# Patient Record
Sex: Female | Born: 1975
Health system: Southern US, Community
[De-identification: ages and names within clinical notes are randomized; demographics above are authoritative.]

## PROBLEM LIST (undated history)

## (undated) DIAGNOSIS — D649 Anemia, unspecified: Secondary | ICD-10-CM

## (undated) DIAGNOSIS — D573 Sickle-cell trait: Secondary | ICD-10-CM

## (undated) DIAGNOSIS — D696 Thrombocytopenia, unspecified: Secondary | ICD-10-CM

## (undated) DIAGNOSIS — R896 Abnormal cytological findings in specimens from other organs, systems and tissues: Secondary | ICD-10-CM

## (undated) HISTORY — DX: Thrombocytopenia, unspecified: D69.6

## (undated) HISTORY — PX: NO PAST SURGERIES: SHX2092

## (undated) HISTORY — DX: Sickle-cell trait: D57.3

## (undated) HISTORY — DX: Anemia, unspecified: D64.9

## (undated) HISTORY — DX: Abnormal cytological findings in specimens from other organs, systems and tissues: R89.6

---

## 1997-09-09 ENCOUNTER — Inpatient Hospital Stay (HOSPITAL_COMMUNITY): Admission: AD | Admit: 1997-09-09 | Discharge: 1997-09-10 | Payer: Self-pay | Admitting: Obstetrics & Gynecology

## 1997-09-11 ENCOUNTER — Inpatient Hospital Stay (HOSPITAL_COMMUNITY): Admission: AD | Admit: 1997-09-11 | Discharge: 1997-09-11 | Payer: Self-pay | Admitting: Obstetrics

## 1998-04-17 ENCOUNTER — Inpatient Hospital Stay (HOSPITAL_COMMUNITY): Admission: AD | Admit: 1998-04-17 | Discharge: 1998-04-17 | Payer: Self-pay | Admitting: *Deleted

## 1999-02-11 DIAGNOSIS — IMO0001 Reserved for inherently not codable concepts without codable children: Secondary | ICD-10-CM

## 1999-02-11 HISTORY — DX: Reserved for inherently not codable concepts without codable children: IMO0001

## 1999-12-18 ENCOUNTER — Other Ambulatory Visit: Admission: RE | Admit: 1999-12-18 | Discharge: 1999-12-18 | Payer: Self-pay | Admitting: Internal Medicine

## 2000-03-10 ENCOUNTER — Other Ambulatory Visit: Admission: RE | Admit: 2000-03-10 | Discharge: 2000-03-10 | Payer: Self-pay | Admitting: Internal Medicine

## 2000-10-20 ENCOUNTER — Other Ambulatory Visit: Admission: RE | Admit: 2000-10-20 | Discharge: 2000-10-20 | Payer: Self-pay | Admitting: Internal Medicine

## 2002-01-04 ENCOUNTER — Other Ambulatory Visit: Admission: RE | Admit: 2002-01-04 | Discharge: 2002-01-04 | Payer: Self-pay | Admitting: Internal Medicine

## 2003-03-22 ENCOUNTER — Other Ambulatory Visit: Admission: RE | Admit: 2003-03-22 | Discharge: 2003-03-22 | Payer: Self-pay | Admitting: Internal Medicine

## 2004-04-09 ENCOUNTER — Ambulatory Visit: Payer: Self-pay | Admitting: Internal Medicine

## 2004-06-05 ENCOUNTER — Other Ambulatory Visit: Admission: RE | Admit: 2004-06-05 | Discharge: 2004-06-05 | Payer: Self-pay | Admitting: Internal Medicine

## 2004-06-05 ENCOUNTER — Ambulatory Visit: Payer: Self-pay | Admitting: Internal Medicine

## 2004-06-05 LAB — CONVERTED CEMR LAB

## 2004-12-03 ENCOUNTER — Ambulatory Visit: Payer: Self-pay | Admitting: Internal Medicine

## 2005-11-21 ENCOUNTER — Other Ambulatory Visit: Admission: RE | Admit: 2005-11-21 | Discharge: 2005-11-21 | Payer: Self-pay | Admitting: Internal Medicine

## 2005-11-21 ENCOUNTER — Encounter: Payer: Self-pay | Admitting: Internal Medicine

## 2005-11-21 ENCOUNTER — Ambulatory Visit: Payer: Self-pay | Admitting: Internal Medicine

## 2005-11-21 LAB — CONVERTED CEMR LAB
ALT: 9 units/L (ref 0–40)
Alkaline Phosphatase: 37 units/L — ABNORMAL LOW (ref 39–117)
Basophils Relative: 0.5 % (ref 0.0–1.0)
Calcium: 9.5 mg/dL (ref 8.4–10.5)
Cholesterol: 177 mg/dL (ref 0–200)
GFR calc non Af Amer: 88 mL/min
Glomerular Filtration Rate, Af Am: 107 mL/min/{1.73_m2}
Glucose, Bld: 90 mg/dL (ref 70–99)
LDL Cholesterol: 104 mg/dL — ABNORMAL HIGH (ref 0–99)
Lymphocytes Relative: 34.7 % (ref 12.0–46.0)
Platelets: 163 10*3/uL (ref 150–400)
Potassium: 4 meq/L (ref 3.5–5.1)
RBC: 4.42 M/uL (ref 3.87–5.11)
RDW: 11.7 % (ref 11.5–14.6)
TSH: 0.71 microintl units/mL (ref 0.35–5.50)
Total Bilirubin: 0.7 mg/dL (ref 0.3–1.2)
Total Protein: 7 g/dL (ref 6.0–8.3)
WBC: 5.7 10*3/uL (ref 4.5–10.5)

## 2006-10-13 ENCOUNTER — Encounter: Payer: Self-pay | Admitting: Internal Medicine

## 2006-11-23 ENCOUNTER — Other Ambulatory Visit: Admission: RE | Admit: 2006-11-23 | Discharge: 2006-11-23 | Payer: Self-pay | Admitting: Internal Medicine

## 2006-11-23 ENCOUNTER — Ambulatory Visit: Payer: Self-pay | Admitting: Internal Medicine

## 2006-11-23 ENCOUNTER — Encounter: Payer: Self-pay | Admitting: Internal Medicine

## 2006-11-23 DIAGNOSIS — F17200 Nicotine dependence, unspecified, uncomplicated: Secondary | ICD-10-CM

## 2006-11-23 LAB — CONVERTED CEMR LAB
Beta hcg, urine, semiquantitative: NEGATIVE
Bilirubin Urine: NEGATIVE
Glucose, Urine, Semiquant: NEGATIVE
Specific Gravity, Urine: 1.015
WBC Urine, dipstick: NEGATIVE
pH: 7

## 2006-11-25 LAB — CONVERTED CEMR LAB
Alkaline Phosphatase: 46 units/L (ref 39–117)
BUN: 9 mg/dL (ref 6–23)
Basophils Absolute: 0 10*3/uL (ref 0.0–0.1)
Basophils Relative: 0.9 % (ref 0.0–1.0)
CO2: 30 meq/L (ref 19–32)
Cholesterol: 161 mg/dL (ref 0–200)
Creatinine, Ser: 0.8 mg/dL (ref 0.4–1.2)
GFR calc Af Amer: 106 mL/min
HCT: 38.5 % (ref 36.0–46.0)
HDL: 64.3 mg/dL (ref >39.0)
Hemoglobin: 13.3 g/dL (ref 12.0–15.0)
Lymphocytes Relative: 44.4 % (ref 12.0–46.0)
MCHC: 34.5 g/dL (ref 30.0–36.0)
Monocytes Absolute: 0.4 10*3/uL (ref 0.2–0.7)
Monocytes Relative: 8.1 % (ref 3.0–11.0)
Neutro Abs: 2.3 10*3/uL (ref 1.4–7.7)
Neutrophils Relative %: 44.7 % (ref 43.0–77.0)
Potassium: 4.9 meq/L (ref 3.5–5.1)
Sodium: 145 meq/L (ref 135–145)
TSH: 1.75 microintl units/mL (ref 0.35–5.50)
Total Bilirubin: 0.6 mg/dL (ref 0.3–1.2)
Total Protein: 6.5 g/dL (ref 6.0–8.3)

## 2006-11-30 ENCOUNTER — Telehealth: Payer: Self-pay | Admitting: *Deleted

## 2007-04-21 ENCOUNTER — Ambulatory Visit: Payer: Self-pay | Admitting: Internal Medicine

## 2007-04-21 DIAGNOSIS — R109 Unspecified abdominal pain: Secondary | ICD-10-CM | POA: Insufficient documentation

## 2007-04-21 LAB — CONVERTED CEMR LAB
Bilirubin Urine: NEGATIVE
Nitrite: NEGATIVE
Protein, U semiquant: NEGATIVE
Urobilinogen, UA: 0.2
WBC Urine, dipstick: NEGATIVE

## 2007-04-22 ENCOUNTER — Telehealth: Payer: Self-pay | Admitting: Internal Medicine

## 2007-04-22 LAB — CONVERTED CEMR LAB
ALT: 11 units/L (ref 0–35)
Albumin: 4.1 g/dL (ref 3.5–5.2)
BUN: 6 mg/dL (ref 6–23)
Basophils Absolute: 0 10*3/uL (ref 0.0–0.1)
Bilirubin, Direct: 0.2 mg/dL (ref 0.0–0.3)
Calcium: 9.8 mg/dL (ref 8.4–10.5)
Chloride: 107 meq/L (ref 96–112)
Eosinophils Absolute: 0 10*3/uL (ref 0.0–0.6)
Eosinophils Relative: 0.8 % (ref 0.0–5.0)
GFR calc Af Amer: 106 mL/min
GFR calc non Af Amer: 88 mL/min
Glucose, Bld: 76 mg/dL (ref 70–99)
Lipase: 20 units/L (ref 11.0–59.0)
Lymphocytes Relative: 44 % (ref 12.0–46.0)
MCHC: 33.3 g/dL (ref 30.0–36.0)
MCV: 91 fL (ref 78.0–100.0)
Monocytes Relative: 7.8 % (ref 3.0–11.0)
Neutro Abs: 2.5 10*3/uL (ref 1.4–7.7)
Platelets: 134 10*3/uL — ABNORMAL LOW (ref 150–400)
RBC: 4.58 M/uL (ref 3.87–5.11)

## 2007-04-23 ENCOUNTER — Telehealth: Payer: Self-pay | Admitting: Internal Medicine

## 2007-05-07 ENCOUNTER — Ambulatory Visit: Payer: Self-pay | Admitting: Internal Medicine

## 2007-05-11 LAB — CONVERTED CEMR LAB
Basophils Absolute: 0 10*3/uL (ref 0.0–0.1)
HCT: 40.5 % (ref 36.0–46.0)
Hemoglobin: 13.4 g/dL (ref 12.0–15.0)
Lymphocytes Relative: 41.6 % (ref 12.0–46.0)
MCHC: 33 g/dL (ref 30.0–36.0)
Monocytes Absolute: 0.3 10*3/uL (ref 0.1–1.0)
Neutro Abs: 2.7 10*3/uL (ref 1.4–7.7)
RDW: 11.7 % (ref 11.5–14.6)

## 2007-06-10 ENCOUNTER — Ambulatory Visit: Payer: Self-pay | Admitting: Internal Medicine

## 2007-06-10 DIAGNOSIS — F4389 Other reactions to severe stress: Secondary | ICD-10-CM | POA: Insufficient documentation

## 2007-06-10 DIAGNOSIS — R079 Chest pain, unspecified: Secondary | ICD-10-CM | POA: Insufficient documentation

## 2007-06-10 DIAGNOSIS — F438 Other reactions to severe stress: Secondary | ICD-10-CM | POA: Insufficient documentation

## 2007-06-10 DIAGNOSIS — M546 Pain in thoracic spine: Secondary | ICD-10-CM | POA: Insufficient documentation

## 2007-11-18 ENCOUNTER — Telehealth: Payer: Self-pay | Admitting: Internal Medicine

## 2007-11-18 ENCOUNTER — Ambulatory Visit: Payer: Self-pay | Admitting: Family Medicine

## 2007-11-18 DIAGNOSIS — R071 Chest pain on breathing: Secondary | ICD-10-CM

## 2007-11-25 ENCOUNTER — Telehealth: Payer: Self-pay | Admitting: Internal Medicine

## 2008-01-14 ENCOUNTER — Encounter: Payer: Self-pay | Admitting: Internal Medicine

## 2008-01-14 ENCOUNTER — Ambulatory Visit: Payer: Self-pay | Admitting: Internal Medicine

## 2008-01-14 ENCOUNTER — Other Ambulatory Visit: Admission: RE | Admit: 2008-01-14 | Discharge: 2008-01-14 | Payer: Self-pay | Admitting: Internal Medicine

## 2008-01-14 LAB — CONVERTED CEMR LAB
Beta hcg, urine, semiquantitative: NEGATIVE
Nitrite: NEGATIVE
Specific Gravity, Urine: 1.025
WBC Urine, dipstick: NEGATIVE

## 2008-01-17 ENCOUNTER — Encounter: Payer: Self-pay | Admitting: Internal Medicine

## 2008-01-17 LAB — CONVERTED CEMR LAB
ALT: 12 units/L (ref 0–35)
Albumin: 4 g/dL (ref 3.5–5.2)
Alkaline Phosphatase: 46 units/L (ref 39–117)
BUN: 8 mg/dL (ref 6–23)
Eosinophils Relative: 1.4 % (ref 0.0–5.0)
GFR calc Af Amer: 106 mL/min
Glucose, Bld: 79 mg/dL (ref 70–99)
HCT: 39.8 % (ref 36.0–46.0)
Hemoglobin: 13.7 g/dL (ref 12.0–15.0)
Monocytes Absolute: 0.4 10*3/uL (ref 0.1–1.0)
Monocytes Relative: 7.7 % (ref 3.0–12.0)
Neutro Abs: 2.1 10*3/uL (ref 1.4–7.7)
Platelets: 145 10*3/uL — ABNORMAL LOW (ref 150–400)
Potassium: 4.2 meq/L (ref 3.5–5.1)
Total CHOL/HDL Ratio: 2.7
Total Protein: 6.8 g/dL (ref 6.0–8.3)
WBC: 4.6 10*3/uL (ref 4.5–10.5)

## 2008-01-18 ENCOUNTER — Telehealth: Payer: Self-pay | Admitting: *Deleted

## 2008-02-17 ENCOUNTER — Ambulatory Visit: Payer: Self-pay | Admitting: Internal Medicine

## 2008-02-17 DIAGNOSIS — N949 Unspecified condition associated with female genital organs and menstrual cycle: Secondary | ICD-10-CM

## 2008-02-28 ENCOUNTER — Ambulatory Visit: Payer: Self-pay | Admitting: Internal Medicine

## 2008-02-28 DIAGNOSIS — H9190 Unspecified hearing loss, unspecified ear: Secondary | ICD-10-CM | POA: Insufficient documentation

## 2008-02-28 DIAGNOSIS — H6120 Impacted cerumen, unspecified ear: Secondary | ICD-10-CM

## 2008-09-15 ENCOUNTER — Ambulatory Visit: Payer: Self-pay | Admitting: Oncology

## 2008-09-18 LAB — CBC & DIFF AND RETIC
BASO%: 0.2 % (ref 0.0–2.0)
EOS%: 0.9 % (ref 0.0–7.0)
HCT: 27.9 % — ABNORMAL LOW (ref 34.8–46.6)
HGB: 9.6 g/dL — ABNORMAL LOW (ref 11.6–15.9)
MCV: 84.8 fL (ref 79.5–101.0)
NEUT#: 2.5 10*3/uL (ref 1.5–6.5)
Platelets: 88 10*3/uL — ABNORMAL LOW (ref 145–400)
RBC: 3.29 10*6/uL — ABNORMAL LOW (ref 3.70–5.45)
Retic %: 1.91 % — ABNORMAL HIGH (ref 0.50–1.50)
Retic Ct Abs: 62.84 10*3/uL (ref 18.30–72.70)
WBC: 4.2 10*3/uL (ref 3.9–10.3)
nRBC: 0 % (ref 0–0)

## 2008-09-18 LAB — MORPHOLOGY

## 2008-09-20 LAB — IRON AND TIBC
%SAT: 39 % (ref 20–55)
Iron: 120 ug/dL (ref 42–145)
TIBC: 311 ug/dL (ref 250–470)

## 2008-09-20 LAB — HEMOGLOBINOPATHY EVALUATION
Hemoglobin Other: 0 % (ref 0.0–0.0)
Hgb A2 Quant: 2.8 % (ref 2.2–3.2)
Hgb A: 61.4 % — ABNORMAL LOW (ref 96.8–97.8)
Hgb S Quant: 35.8 % — ABNORMAL HIGH (ref 0.0–0.0)

## 2008-09-20 LAB — COMPREHENSIVE METABOLIC PANEL
ALT: 10 U/L (ref 0–35)
AST: 17 U/L (ref 0–37)
BUN: 4 mg/dL — ABNORMAL LOW (ref 6–23)
Calcium: 9 mg/dL (ref 8.4–10.5)
Chloride: 110 mEq/L (ref 96–112)
Creatinine, Ser: 0.68 mg/dL (ref 0.40–1.20)
Total Bilirubin: 0.3 mg/dL (ref 0.3–1.2)

## 2008-09-20 LAB — DIRECT ANTIGLOBULIN TEST (NOT AT ARMC)
DAT (Complement): NEGATIVE
DAT IgG: NEGATIVE

## 2008-09-20 LAB — VITAMIN B12: Vitamin B-12: 296 pg/mL (ref 211–911)

## 2008-09-20 LAB — FOLATE: Folate: >20 ng/mL

## 2008-09-20 LAB — FERRITIN: Ferritin: 37 ng/mL (ref 10–291)

## 2008-09-29 LAB — CBC WITH DIFFERENTIAL/PLATELET
BASO%: 0.6 % (ref 0.0–2.0)
EOS%: 1 % (ref 0.0–7.0)
MCH: 30.5 pg (ref 25.1–34.0)
MCHC: 34.2 g/dL (ref 31.5–36.0)
MONO#: 0.4 10*3/uL (ref 0.1–0.9)
RBC: 3.44 10*6/uL — ABNORMAL LOW (ref 3.70–5.45)
RDW: 13.2 % (ref 11.2–14.5)
WBC: 5.3 10*3/uL (ref 3.9–10.3)
lymph#: 1.9 10*3/uL (ref 0.9–3.3)

## 2008-09-29 LAB — COMPREHENSIVE METABOLIC PANEL
ALT: 10 U/L (ref 0–35)
AST: 20 U/L (ref 0–37)
Albumin: 3.2 g/dL — ABNORMAL LOW (ref 3.5–5.2)
Calcium: 8.6 mg/dL (ref 8.4–10.5)
Chloride: 109 mEq/L (ref 96–112)
Creatinine, Ser: 0.83 mg/dL (ref 0.40–1.20)
Potassium: 4.2 mEq/L (ref 3.5–5.3)
Sodium: 139 mEq/L (ref 135–145)
Total Protein: 5.6 g/dL — ABNORMAL LOW (ref 6.0–8.3)

## 2008-10-11 ENCOUNTER — Inpatient Hospital Stay (HOSPITAL_COMMUNITY): Admission: AD | Admit: 2008-10-11 | Discharge: 2008-10-15 | Payer: Self-pay | Admitting: Obstetrics and Gynecology

## 2008-10-11 ENCOUNTER — Ambulatory Visit: Payer: Self-pay | Admitting: Obstetrics & Gynecology

## 2008-10-17 ENCOUNTER — Encounter: Admission: RE | Admit: 2008-10-17 | Discharge: 2008-11-15 | Payer: Self-pay | Admitting: Obstetrics

## 2008-10-22 ENCOUNTER — Ambulatory Visit (HOSPITAL_COMMUNITY): Admission: AD | Admit: 2008-10-22 | Discharge: 2008-10-22 | Payer: Self-pay | Admitting: Obstetrics & Gynecology

## 2008-10-22 ENCOUNTER — Encounter (INDEPENDENT_AMBULATORY_CARE_PROVIDER_SITE_OTHER): Payer: Self-pay | Admitting: Obstetrics & Gynecology

## 2008-10-25 ENCOUNTER — Ambulatory Visit: Payer: Self-pay | Admitting: Oncology

## 2008-11-16 ENCOUNTER — Encounter: Admission: RE | Admit: 2008-11-16 | Discharge: 2008-12-16 | Payer: Self-pay | Admitting: Obstetrics

## 2008-12-17 ENCOUNTER — Encounter: Admission: RE | Admit: 2008-12-17 | Discharge: 2009-01-09 | Payer: Self-pay | Admitting: Obstetrics

## 2009-03-08 ENCOUNTER — Ambulatory Visit: Payer: Self-pay | Admitting: Oncology

## 2009-03-12 LAB — CBC WITH DIFFERENTIAL/PLATELET
Basophils Absolute: 0.1 10*3/uL (ref 0.0–0.1)
EOS%: 0.8 % (ref 0.0–7.0)
HCT: 36.5 % (ref 34.8–46.6)
HGB: 12.1 g/dL (ref 11.6–15.9)
LYMPH%: 33.8 % (ref 14.0–49.7)
MCH: 27.3 pg (ref 25.1–34.0)
MCV: 82.2 fL (ref 79.5–101.0)
MONO%: 5 % (ref 0.0–14.0)
NEUT%: 59.7 % (ref 38.4–76.8)
Platelets: 160 10*3/uL (ref 145–400)
RDW: 15.2 % — ABNORMAL HIGH (ref 11.2–14.5)

## 2009-03-12 LAB — IRON AND TIBC
Iron: 62 ug/dL (ref 42–145)
TIBC: 292 ug/dL (ref 250–470)
UIBC: 230 ug/dL

## 2009-03-12 LAB — COMPREHENSIVE METABOLIC PANEL
Alkaline Phosphatase: 61 U/L (ref 39–117)
BUN: 11 mg/dL (ref 6–23)
CO2: 26 mEq/L (ref 19–32)
Creatinine, Ser: 0.94 mg/dL (ref 0.40–1.20)
Glucose, Bld: 65 mg/dL — ABNORMAL LOW (ref 70–99)
Total Bilirubin: 0.3 mg/dL (ref 0.3–1.2)

## 2009-03-12 LAB — FERRITIN: Ferritin: 50 ng/mL (ref 10–291)

## 2010-05-17 LAB — COMPREHENSIVE METABOLIC PANEL
ALT: 15 U/L (ref 0–35)
ALT: 16 U/L (ref 0–35)
ALT: 16 U/L (ref 0–35)
ALT: 16 U/L (ref 0–35)
AST: 25 U/L (ref 0–37)
AST: 31 U/L (ref 0–37)
AST: 32 U/L (ref 0–37)
Albumin: 2.7 g/dL — ABNORMAL LOW (ref 3.5–5.2)
Alkaline Phosphatase: 127 U/L — ABNORMAL HIGH (ref 39–117)
CO2: 20 mEq/L (ref 19–32)
CO2: 22 mEq/L (ref 19–32)
CO2: 27 mEq/L (ref 19–32)
Calcium: 8 mg/dL — ABNORMAL LOW (ref 8.4–10.5)
Calcium: 9.1 mg/dL (ref 8.4–10.5)
Chloride: 110 mEq/L (ref 96–112)
Chloride: 111 mEq/L (ref 96–112)
Creatinine, Ser: 0.91 mg/dL (ref 0.4–1.2)
Creatinine, Ser: 0.94 mg/dL (ref 0.4–1.2)
GFR calc Af Amer: >60 mL/min (ref >60)
GFR calc Af Amer: >60 mL/min (ref >60)
GFR calc Af Amer: >60 mL/min (ref >60)
GFR calc Af Amer: >60 mL/min (ref >60)
GFR calc non Af Amer: >60 mL/min (ref >60)
GFR calc non Af Amer: >60 mL/min (ref >60)
Glucose, Bld: 66 mg/dL — ABNORMAL LOW (ref 70–99)
Potassium: 3.9 mEq/L (ref 3.5–5.1)
Potassium: 4.5 mEq/L (ref 3.5–5.1)
Sodium: 136 mEq/L (ref 135–145)
Sodium: 136 mEq/L (ref 135–145)
Sodium: 140 mEq/L (ref 135–145)
Total Bilirubin: 0.4 mg/dL (ref 0.3–1.2)
Total Bilirubin: 0.5 mg/dL (ref 0.3–1.2)
Total Protein: 4.9 g/dL — ABNORMAL LOW (ref 6.0–8.3)
Total Protein: 5.6 g/dL — ABNORMAL LOW (ref 6.0–8.3)

## 2010-05-17 LAB — CROSSMATCH

## 2010-05-17 LAB — CBC
HCT: 22.4 % — ABNORMAL LOW (ref 36.0–46.0)
HCT: 29.3 % — ABNORMAL LOW (ref 36.0–46.0)
HCT: 30.1 % — ABNORMAL LOW (ref 36.0–46.0)
HCT: 30.9 % — ABNORMAL LOW (ref 36.0–46.0)
Hemoglobin: 7.4 g/dL — CL (ref 12.0–15.0)
Hemoglobin: 9.9 g/dL — ABNORMAL LOW (ref 12.0–15.0)
MCHC: 33.4 g/dL (ref 30.0–36.0)
MCHC: 33.5 g/dL (ref 30.0–36.0)
MCHC: 33.6 g/dL (ref 30.0–36.0)
MCHC: 33.7 g/dL (ref 30.0–36.0)
MCV: 90.6 fL (ref 78.0–100.0)
MCV: 91.4 fL (ref 78.0–100.0)
MCV: 91.7 fL (ref 78.0–100.0)
MCV: 92.3 fL (ref 78.0–100.0)
Platelets: 233 10*3/uL (ref 150–400)
Platelets: 75 10*3/uL — ABNORMAL LOW (ref 150–400)
Platelets: 77 10*3/uL — ABNORMAL LOW (ref 150–400)
Platelets: 82 10*3/uL — ABNORMAL LOW (ref 150–400)
RBC: 2.76 MIL/uL — ABNORMAL LOW (ref 3.87–5.11)
RBC: 2.89 MIL/uL — ABNORMAL LOW (ref 3.87–5.11)
RBC: 2.9 MIL/uL — ABNORMAL LOW (ref 3.87–5.11)
RBC: 3.18 MIL/uL — ABNORMAL LOW (ref 3.87–5.11)
RDW: 13.1 % (ref 11.5–15.5)
RDW: 13.1 % (ref 11.5–15.5)
RDW: 13.3 % (ref 11.5–15.5)
RDW: 13.3 % (ref 11.5–15.5)
RDW: 13.5 % (ref 11.5–15.5)
RDW: 13.6 % (ref 11.5–15.5)
WBC: 12.4 10*3/uL — ABNORMAL HIGH (ref 4.0–10.5)
WBC: 13.4 10*3/uL — ABNORMAL HIGH (ref 4.0–10.5)
WBC: 16.9 10*3/uL — ABNORMAL HIGH (ref 4.0–10.5)
WBC: 8.5 10*3/uL (ref 4.0–10.5)

## 2010-05-17 LAB — APTT: aPTT: 27 seconds (ref 24–37)

## 2010-05-17 LAB — DIC (DISSEMINATED INTRAVASCULAR COAGULATION)PANEL: D-Dimer, Quant: 5.96 ug/mL-FEU — ABNORMAL HIGH (ref 0.00–0.48)

## 2010-05-17 LAB — RPR: RPR Ser Ql: NONREACTIVE

## 2010-06-25 NOTE — Consult Note (Signed)
Best, Susan                  ACCOUNT NO.:  [id number]   MEDICAL RECORD NO.:  [id number]          PATIENT TYPE:  INP   LOCATION:  9105                          FACILITY:  [facility]   PHYSICIAN:  Lendon Colonel, MD   DATE OF BIRTH:  January 01, 1976   DATE OF CONSULTATION:  10/12/2008  DATE OF DISCHARGE:                                 CONSULTATION   PREOPERATIVE DIAGNOSES:  Thrombocytopenia, arrest of descent.   POSTOPERATIVE DIAGNOSES:  Thrombocytopenia, arrest of descent.   PROCEDURE:  Primary low-transverse cesarean section.   SURGEON:  Lendon Colonel, MD   ASSISTANT:  Scheryl Darter, MD   ANESTHESIA:  Epidural.   FINDINGS:  Female infant in the LOT position, Apgars 8 and 9, 7 pounds 1  ounces.  Normal uterus, tubes, and ovaries.   SPECIMENS:  Placenta to L and D.   ANTIBIOTICS:  1 g of Ancef.   ESTIMATED BLOOD LOSS:  Unknown at time of dictation.   COMPLICATIONS:  None.   INDICATIONS:  This is a 35 year old, G1 at 40 weeks and 4 days gestation  being induced for thrombocytopenia and past EDC.  The patient had onset  of active labor after single dose of 25 mcg of Cytotec, received an  epidural with a platelet count of 82,000, and progressed rapidly to 5  cm.  At that point, she was artificially ruptured.  Fetal testing  remained reactive over the next several hours.  No change was made and  IUPC was placed and Pitocin was started over the next subsequent hours.  The patient made very slow change from 5 cm to 7 cm.  She remains at 7  cm with adequate [place] unit for 3 hours and over a total of 12  hours had only changed 2 cm.  The patient's cervix went from 5-7 cm with  no change in fetal descent.  Decision was made to proceed with the  primary low-transverse cesarean section.  Repeat CBC and coags were  obtained.  The patient was typed and crossed for 2 units of blood and  platelets were made available.   PROCEDURE:  After informed consent was obtained, risks,  benefits, and  alternatives were discussed with the patient including risk of  hemorrhage and need for blood products.  The patient was taken to the  operating room, where epidural anesthesia was found to be adequate.  She  was prepped and draped in the normal sterile fashion in the dorsal  supine position with a leftward tilt.  A Pfannenstiel skin incision made  2 cm above the pubic symphysis with a scalpel carried through to the  underlying fascia with the Bovie.  The fascia was incised in the  midline.  The incision was extended laterally with the Mayo scissors.  The superior aspect of the fascial incision was grasped with Kocher  clamps elevated up and the underlying rectus muscles dissected off the  Bovie cautery.  The inferior aspect of the fascial incision was grasped  with Kocher clamps, elevated up, and the underlying rectus muscles  dissected off sharply with the  Mayo.  No good hemostasis was noted.  The  rectus muscles were separated bluntly in the midline.  The peritoneum  was identified and entered bluntly.  Peritoneal incision was extended  superiorly and inferiorly with good visualization of the bladder.  The  bladder blade was inserted.  Vesicouterine peritoneum was identified,  grasped with pickups, and did sharply.  An incision was extended  laterally with a Metzenbaum scissors.  The bladder flap was created  digitally.  Bladder blade was reinserted.  The lower uterine segment  incised in the transverse fashion with the scalpel and extended bluntly.  Of note, a packing was placed in the patient's right side of the abdomen  due to bowel protruding.  This was done before the uterine incision.  The incision was extended.  The infants occiput was grasped, rotated  brought to the incision, and delivered.  Bulb suction was used to clear  the nose and the mouth.  Remainder of the infant was delivered without  complication.  Cord was clamped and cut.  The infant was heading to  the  awaiting pediatrician.  The placenta was expressed.  The uterus was  exteriorized, clear of all clots and debris.  The uterine incision was  repaired with 0 Vicryl in a running locked fashion.  The second layer of  the same suture was used in the imbricating fashion for good hemostasis.  An additional figure-of-eight suture was placed in the mid point of the  incision for hemostasis, 40 units of Pitocin was used for hemostasis.  The uterus was returned to the abdomen.  The gutters were cleared of all  clots and debris.  The uterine incision was reinspected.  Small amount  of bleeding was noted from the right angle of the incision.  This was  oversewn with another figure-of-eight of 0 Vicryl.  Excellent hemostasis  was done.  All tagged edges were cut.  The peritoneum was closed with 2-  0 Vicryl.  After the moist, tag was removed from the abdomen.  The  underside of the fashion, muscle edges were inspected and found to be  hemostatic.  A small defect in the fascia.  The upper edge was oversewn  with a figure-of-eight of 0 Vicryl.  The fascia was oversewn with 0  Vicryl in a running fashion in 2 halves.  A 2-0 plain was used to close  the Scarpa's later and close any dead space in the skin was closed with  staples.  The patient tolerated the procedure well.  Sponge, lap, and  needle counts were correct x3 and the patient was taken to the recovery  room in stable condition.      Lendon Colonel, MD  Electronically Signed     KAF/MEDQ  D:  10/12/2008  T:  10/13/2008  Job:  [id number]

## 2011-02-19 ENCOUNTER — Ambulatory Visit (INDEPENDENT_AMBULATORY_CARE_PROVIDER_SITE_OTHER): Payer: Self-pay | Admitting: Internal Medicine

## 2011-02-19 ENCOUNTER — Encounter: Payer: Self-pay | Admitting: Internal Medicine

## 2011-02-19 ENCOUNTER — Other Ambulatory Visit (HOSPITAL_COMMUNITY)
Admission: RE | Admit: 2011-02-19 | Discharge: 2011-02-19 | Disposition: A | Discharge status: Home / Self Care | Payer: BC Managed Care – PPO | Source: Ambulatory Visit | Attending: Internal Medicine | Admitting: Internal Medicine

## 2011-02-19 VITALS — BP 100/60 | HR 66 | Temp 98.5°F | Wt 158.0 lb

## 2011-02-19 DIAGNOSIS — L293 Anogenital pruritus, unspecified: Secondary | ICD-10-CM | POA: Insufficient documentation

## 2011-02-19 DIAGNOSIS — N760 Acute vaginitis: Secondary | ICD-10-CM | POA: Insufficient documentation

## 2011-02-19 DIAGNOSIS — L290 Pruritus ani: Secondary | ICD-10-CM

## 2011-02-19 DIAGNOSIS — Z113 Encounter for screening for infections with a predominantly sexual mode of transmission: Secondary | ICD-10-CM | POA: Insufficient documentation

## 2011-02-19 DIAGNOSIS — Z01419 Encounter for gynecological examination (general) (routine) without abnormal findings: Secondary | ICD-10-CM | POA: Insufficient documentation

## 2011-02-19 NOTE — Patient Instructions (Signed)
Will notify you  of labs when available. You exam is ok the itching may be from  Irritation in the skin creases  And can use HCS and monistat if needed. But less is best.  Call if needed in the meantime.

## 2011-02-19 NOTE — Progress Notes (Signed)
  Subjective:    Patient ID: Susan Best, female    DOB: [birth year], 36 y.o.   MRN: [id number]  HPI  Patient comes in today because of concerns about vaginal itching. She also had a new partner without using protection. She denies any specific discharge but has some itching Vaginal itching  For 1 week.  Just wants to be sure. She has not used any treatment. Last Pap and checkup with Dr. Algie Coffer was in November. She was also screened at that time  Denies any sores or ulcers is just very worried no unusual rashes. lmps pre x mas    has a iud  Mirena.  Review of Systems No fever abd pain   NVD other rash.  Past history family history social history reviewed in the electronic medical record.     Objective:   Physical Exam Well-developed well-nourished in no acute distress Abdomen:  Sof,t normal bowel sounds without hepatosplenomegaly, no guarding rebound or masses no CVA tenderness NO inguin adenopathy EXT GU nl but there is some redness in her intertriginous areas but no vesicles cracking or discharge. Cervix white creamy discharge no lesions  sample taken for GC Chlamydia herpes trach Gardnerella and yeast.     Assessment & Plan:  Vaginal itching  check for vaginitis culprits doubt STD but has GI screening is appropriate with history  Discussed this and we'll get her results as soon as they are available. She will call in couple days before the weekend to see if they are back.  Counseling for future problem prevention Disc options of blood tests and we both think not necessary at this time.  Also this may be just perineal itching related to external factors she continues hydrocortisone or Monistat as a trial.   Total visit > 50% spent counseling  About above findings  and coordinating care

## 2011-02-26 NOTE — Progress Notes (Signed)
Quick Note:  Pt aware that all results were neg. ______

## 2011-06-23 ENCOUNTER — Telehealth: Payer: Self-pay | Admitting: Internal Medicine

## 2011-06-23 NOTE — Telephone Encounter (Signed)
Pt has severe pain in head. Feels like toothache, but has been to a endodontist, but its not her teeth. Pt req work in ov with Dr Fabian Sharp tomorrow. Only sdas are avail.

## 2011-06-24 ENCOUNTER — Telehealth: Payer: Self-pay

## 2011-06-24 ENCOUNTER — Ambulatory Visit (INDEPENDENT_AMBULATORY_CARE_PROVIDER_SITE_OTHER): Payer: BC Managed Care – PPO | Admitting: Internal Medicine

## 2011-06-24 ENCOUNTER — Ambulatory Visit (INDEPENDENT_AMBULATORY_CARE_PROVIDER_SITE_OTHER)
Admission: RE | Admit: 2011-06-24 | Discharge: 2011-06-24 | Disposition: A | Discharge status: Home / Self Care | Payer: BC Managed Care – PPO | Source: Ambulatory Visit | Attending: Internal Medicine | Admitting: Internal Medicine

## 2011-06-24 ENCOUNTER — Encounter: Payer: Self-pay | Admitting: Internal Medicine

## 2011-06-24 VITALS — BP 110/74 | HR 64 | Temp 98.7°F | Wt 156.0 lb

## 2011-06-24 DIAGNOSIS — R51 Headache: Secondary | ICD-10-CM

## 2011-06-24 DIAGNOSIS — K089 Disorder of teeth and supporting structures, unspecified: Secondary | ICD-10-CM

## 2011-06-24 DIAGNOSIS — R519 Headache, unspecified: Secondary | ICD-10-CM | POA: Insufficient documentation

## 2011-06-24 DIAGNOSIS — F17200 Nicotine dependence, unspecified, uncomplicated: Secondary | ICD-10-CM

## 2011-06-24 DIAGNOSIS — K0889 Other specified disorders of teeth and supporting structures: Secondary | ICD-10-CM

## 2011-06-24 MED ORDER — PREDNISONE 20 MG PO TABS: ORAL_TABLET | ORAL | Status: AC

## 2011-06-24 NOTE — Patient Instructions (Signed)
Get the sinus ct scan  To R/O sinusitis  This could be atypical face pain and may get relief with meds used for nerve pain.  If sinus ct is normal then we may add meds for nerve pain and get neurology to see you.

## 2011-06-24 NOTE — Progress Notes (Signed)
Quick Note:  Spoke with pt and pt is aware of results. ______

## 2011-06-24 NOTE — Progress Notes (Signed)
  Subjective:    Patient ID: Susan Best, female    DOB: 1975-08-29, 36 y.o.   MRN: [id number]  HPI Patient comes in today for an acute visit work in. She's had the onset 4-5 days ago that woke her up from sleep of severe left facial pain which she calls a toothache. She had no associated fever the pain comes and goes becomes a 10 out of 10 when it is bad. It makes it hard for her to eat. She went to her dentist and then her on to don does in both of them said it was not her teeth and that she should see her primary doctor or perhaps a neurologist. She feels that it could be a sinus infection she has some mild congestion but no fever head cold. She's had no trauma to her head denies TMJ her previous symptoms such as this. She's been taking ibuprofen fairly often but this and isn't helping. Currently her pain is   down to a tolerable level but is fearful he is going to get worse again.  Review of Systems Negative for fever or syncope vision change hearing change she still smokes but no chest pain shortness of breath no history of significant migraines. She states her hearing is okay.  Past history family history social history reviewed in the electronic medical record.     Objective:   Physical Exam BP 110/74  Pulse 64  Temp(Src) 98.7 F (37.1 C) (Oral)  Wt 156 lb (70.761 kg) HEENT: Normocephalic ;atraumatic , Eyes;  PERRL, EOMs  Full, lids and conjunctiva clear,,Ears: no deformities, canals nl, TM landmarks normal, Nose: no deformity or discharge  Mouth : OP clear without lesion or edema .  Area o upper and lower tooth jaw pain without tmj tenderness  Neck: Supple without adenopathy or masses or bruits Chest:  Clear to A without wheezes rales or rhonchi CV:  S1-S2 no gallops or murmurs peripheral perfusion is normal NEURO: oriented x 3 CN 3-12 appear intact. No focal muscle weakness or atrophy.  . Gait WNL.  Grossly non focal. No tremor or abnormal movement. TMJ area is nontender she points  to the left cheek jaw area as area of pain and there is no acute rashes.     Assessment & Plan:  Left face pain tooth and jaw area  intermittent awoken from sleep persisting. This is a new onset syndrome.  Negative dental evaluation. Patient feels she could have sinusitis but is only minimally congested. We'll get sinus CT to rule out sinus causes of her pain consider treatment for atypical facial pain versus migraine and referral as appropriate.  Tobacco advise DC  Addendum CT scan was negative for sinus disease or abnormality. Encouraged to take prednisone 5 days for inflammatory response. She asks about pain pill we may add medicine after that consider gabapentin or Tegretol or other.refer to neurology.

## 2011-06-24 NOTE — Telephone Encounter (Signed)
Pt called this morning to check on status of getting work in ov for today. Pt has been sch for ov today at 2:15pm.

## 2011-06-24 NOTE — Telephone Encounter (Signed)
Pt is aware of ct sinus results.  Pt states she has some reservations about prednsione.  Pt states when she was pregnant pt had to go on prednisone and her lab was induced.  Pt states she will try prednisone if Dr. Fabian Sharp recommends this.  Rx sent to pharmacy.  Advised pt to call back on Friday to report how pt is feeling.

## 2011-12-13 ENCOUNTER — Encounter (HOSPITAL_COMMUNITY): Payer: Self-pay

## 2011-12-13 ENCOUNTER — Emergency Department (HOSPITAL_COMMUNITY)
Admission: EM | Admit: 2011-12-13 | Discharge: 2011-12-14 | Disposition: A | Discharge status: Home / Self Care | Payer: BC Managed Care – PPO | Attending: Emergency Medicine | Admitting: Emergency Medicine

## 2011-12-13 DIAGNOSIS — Y9301 Activity, walking, marching and hiking: Secondary | ICD-10-CM | POA: Insufficient documentation

## 2011-12-13 DIAGNOSIS — Y9289 Other specified places as the place of occurrence of the external cause: Secondary | ICD-10-CM | POA: Insufficient documentation

## 2011-12-13 DIAGNOSIS — K0889 Other specified disorders of teeth and supporting structures: Secondary | ICD-10-CM

## 2011-12-13 DIAGNOSIS — F411 Generalized anxiety disorder: Secondary | ICD-10-CM | POA: Insufficient documentation

## 2011-12-13 DIAGNOSIS — W010XXA Fall on same level from slipping, tripping and stumbling without subsequent striking against object, initial encounter: Secondary | ICD-10-CM | POA: Insufficient documentation

## 2011-12-13 DIAGNOSIS — F17200 Nicotine dependence, unspecified, uncomplicated: Secondary | ICD-10-CM | POA: Insufficient documentation

## 2011-12-13 NOTE — ED Notes (Signed)
Pt sts fell today tripped in walkway and hit mouth on cement, pt chipped front tooth and complains and pain an loose feeling to teeth, minimal swelling noted to upper lip.

## 2011-12-14 NOTE — ED Notes (Signed)
See paper chart for discharge

## 2011-12-15 NOTE — ED Provider Notes (Signed)
Medical screening examination/treatment/procedure(s) were performed by non-physician practitioner and as supervising physician I was immediately available for consultation/collaboration.    Vida Roller, MD 12/15/11 [phone]

## 2011-12-15 NOTE — ED Provider Notes (Signed)
History     CSN: [id number]  Arrival date & time 12/13/11  2339   First MD Initiated Contact with Patient 12/14/11 0016      Chief Complaint  Patient presents with  . Fall    (Consider location/radiation/quality/duration/timing/severity/associated sxs/prior treatment) HPI Comments: 36 y/o female presents to the ED with a chipped and loose front tooth after tripping in her walkway hitting her mouth on cement prior to arrival to ED. Denies hitting her head or LOC. She was wearing really high heels and one heel got stuck causing her to trip. She chipped the upper left front tooth and her upper right front tooth feels loose. She tried call her dentist's emergency line and has not yet had a call back. States her teeth hurt and feel sensitive. She began bleeding right away. Denies any other injuries.  Patient is a 36 y.o. female presenting with fall. The history is provided by the patient.  Fall Pertinent negatives include no headaches.    Past Medical History  Diagnosis Date  . Abnormal finding on Pap smear, ASCUS 2001    neg hpv    Past Surgical History  Procedure Date  . No past surgeries     Family History  Problem Relation Age of Onset  . Hyperlipidemia    . Hypertension    . GER disease Father     History  Substance Use Topics  . Smoking status: Current Every Day Smoker -- 1.0 packs/day    Types: Cigarettes  . Smokeless tobacco: Not on file  . Alcohol Use: Yes     Comment: 1-2 per day    OB History    Grav Para Term Preterm Abortions TAB SAB Ect Mult Living                  Review of Systems  HENT: Positive for dental problem. Negative for neck pain.   Respiratory: Negative for choking.   Skin: Negative for color change and wound.  Neurological: Negative for headaches.  Psychiatric/Behavioral: Negative for confusion. The patient is nervous/anxious.     Allergies  Review of patient's allergies indicates no known allergies.  Home Medications   Current  Outpatient Rx  Name  Route  Sig  Dispense  Refill  . RANITIDINE HCL 150 MG PO TABS   Oral   Take 150 mg by mouth 2 (two) times daily as needed. For acid reflux           BP 110/85  Pulse 62  Temp 98.1 F (36.7 C) (Oral)  Resp 18  SpO2 98%  Physical Exam  Constitutional: She is oriented to person, place, and time. She appears well-developed and well-nourished. No distress.       Crying   HENT:  Head: Normocephalic and atraumatic. Head is without laceration.  Mouth/Throat: Oropharynx is clear and moist. No lacerations.         Mild edema in middle of lower lip. No laceration or bleeding.  Eyes: Conjunctivae normal and EOM are normal. Pupils are equal, round, and reactive to light.  Neck: Normal range of motion. Neck supple.  Cardiovascular: Normal rate, regular rhythm and normal heart sounds.   Musculoskeletal: Normal range of motion.  Neurological: She is alert and oriented to person, place, and time.  Skin: Skin is warm and dry.  Psychiatric: Her speech is normal and behavior is normal. Her mood appears anxious.    ED Course  Procedures (including critical care time) Dental paste used to bond right  front tooth to both surrounding teeth. Labs Reviewed - No data to display No results found.   1. Loose tooth due to trauma       MDM  Loose tooth bonded with dental paste. No subluxation or dislocation. Bleeding controlled. Patient has dentist to f/u with.        Trevor Mace, PA-C 12/15/11 [phone]

## 2012-01-23 ENCOUNTER — Telehealth: Payer: Self-pay | Admitting: Internal Medicine

## 2012-01-23 NOTE — Telephone Encounter (Signed)
Called and spoke to the pt.  She was notified that [facility] is out of the office for the remainder of this week and next week.  She will need to come in and be seen by another physician.  Sent to scheduling to make the appt.

## 2012-01-23 NOTE — Telephone Encounter (Signed)
Patient called stating that she fell in November and hurt her right knee and now she is having pain upon trying to bend it and would like to have an xray. Please advise/assist.

## 2012-01-27 ENCOUNTER — Encounter: Payer: Self-pay | Admitting: Family Medicine

## 2012-01-27 ENCOUNTER — Ambulatory Visit: Payer: BC Managed Care – PPO | Admitting: Family Medicine

## 2012-01-27 ENCOUNTER — Ambulatory Visit (INDEPENDENT_AMBULATORY_CARE_PROVIDER_SITE_OTHER): Payer: BC Managed Care – PPO | Admitting: Family Medicine

## 2012-01-27 VITALS — BP 106/76 | HR 70 | Temp 98.0°F | Wt 158.0 lb

## 2012-01-27 DIAGNOSIS — M25569 Pain in unspecified knee: Secondary | ICD-10-CM

## 2012-01-27 MED ORDER — DICLOFENAC SODIUM 75 MG PO TBEC: 75.0000 mg | DELAYED_RELEASE_TABLET | Freq: Two times a day (BID) | ORAL | Status: DC

## 2012-01-27 NOTE — Progress Notes (Signed)
  Subjective:    Patient ID: Susan Best, female    DOB: [birth year], 36 y.o.   MRN: [id number]  HPI Here for 2 weeks of pain in the right knee. She fell in her driveway on 12-13-11 and struck her face, and she went to the ER for this. At that time her knee did not bother her. Then 2 weeks ago she developed some pain in the front of the knee under the kneecap, especially when walking up steps or squatting down. No swelling. No locking or giving way. She has done nothing for it.    Review of Systems  Constitutional: Negative.   Musculoskeletal: Positive for arthralgias. Negative for joint swelling.       Objective:   Physical Exam  Constitutional: She appears well-developed and well-nourished.       She walks normally   Musculoskeletal:       The right knee has no swelling, no tenderness at all. Full ROM, negative anterior drawer and McMurrays           Assessment & Plan:  Possible tendonitis S/P trauma. Rest, wear an elastic support brace. Use Diclofenac bid for several weeks. Recheck prn

## 2012-07-13 ENCOUNTER — Encounter: Payer: Self-pay | Admitting: Family

## 2012-07-13 ENCOUNTER — Ambulatory Visit (INDEPENDENT_AMBULATORY_CARE_PROVIDER_SITE_OTHER): Payer: BC Managed Care – PPO | Admitting: Family

## 2012-07-13 VITALS — BP 96/62 | HR 76 | Wt 162.0 lb

## 2012-07-13 DIAGNOSIS — T148XXA Other injury of unspecified body region, initial encounter: Secondary | ICD-10-CM

## 2012-07-13 DIAGNOSIS — M545 Low back pain, unspecified: Secondary | ICD-10-CM
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
## 2012-12-16 ENCOUNTER — Other Ambulatory Visit: Payer: Self-pay

## 2013-02-09 ENCOUNTER — Ambulatory Visit (INDEPENDENT_AMBULATORY_CARE_PROVIDER_SITE_OTHER): Payer: BC Managed Care – PPO | Admitting: Family Medicine

## 2013-02-09 DIAGNOSIS — Z23 Encounter for immunization: Secondary | ICD-10-CM

## 2013-07-12 ENCOUNTER — Other Ambulatory Visit: Payer: Self-pay

## 2013-07-12 DIAGNOSIS — Z1231 Encounter for screening mammogram for malignant neoplasm of breast: Secondary | ICD-10-CM

## 2013-07-28 ENCOUNTER — Ambulatory Visit
Admission: RE | Admit: 2013-07-28 | Discharge: 2013-07-28 | Disposition: A | Discharge status: Home / Self Care | Payer: BC Managed Care – PPO | Source: Ambulatory Visit

## 2013-07-28 DIAGNOSIS — Z1231 Encounter for screening mammogram for malignant neoplasm of breast: Secondary | ICD-10-CM

## 2013-12-12 ENCOUNTER — Ambulatory Visit (INDEPENDENT_AMBULATORY_CARE_PROVIDER_SITE_OTHER): Payer: Self-pay | Admitting: Family Medicine

## 2013-12-12 VITALS — BP 102/60 | HR 66 | Temp 97.8°F | Resp 16

## 2013-12-12 DIAGNOSIS — R6883 Chills (without fever): Secondary | ICD-10-CM

## 2013-12-12 DIAGNOSIS — R42 Dizziness and giddiness: Secondary | ICD-10-CM

## 2013-12-12 LAB — POCT CBC
Granulocyte percent: 63 % (ref 37–80)
HCT, POC: 42.8 % (ref 37.7–47.9)
Hemoglobin: 14 g/dL (ref 12.2–16.2)
Lymph, poc: 1.4 (ref 0.6–3.4)
MCH, POC: 29.2 pg (ref 27–31.2)
MCHC: 32.6 g/dL (ref 31.8–35.4)
MCV: 89.5 fL (ref 80–97)
MID (cbc): 0.2 (ref 0–0.9)
MPV: 7.9 fL (ref 0–99.8)
POC Granulocyte: 2.8 (ref 2–6.9)
POC LYMPH PERCENT: 32.2 %L (ref 10–50)
POC MID %: 4.8 % (ref 0–12)
Platelet Count, POC: 139 10*3/uL — AB (ref 142–424)
RBC: 4.78 M/uL (ref 4.04–5.48)
RDW, POC: 12.2 %
WBC: 4.4 10*3/uL — AB (ref 4.6–10.2)

## 2013-12-12 LAB — POCT UA - MICROSCOPIC ONLY
Bacteria, U Microscopic: NEGATIVE
Casts, Ur, LPF, POC: NEGATIVE
Crystals, Ur, HPF, POC: NEGATIVE
Mucus, UA: NEGATIVE
Yeast, UA: NEGATIVE

## 2013-12-12 LAB — POCT URINALYSIS DIPSTICK
Bilirubin, UA: NEGATIVE
Blood, UA: NEGATIVE
Glucose, UA: NEGATIVE
Ketones, UA: NEGATIVE
Leukocytes, UA: NEGATIVE
Nitrite, UA: NEGATIVE
Protein, UA: NEGATIVE
Spec Grav, UA: <=1.005
Urobilinogen, UA: 0.2
pH, UA: 6.5

## 2013-12-12 NOTE — Progress Notes (Signed)
Chief Complaint:  Chief Complaint  Patient presents with  . Dizziness    Started this morning. Pt. felt like she was about to fall out.   . Body Shakes    Started this morning. Comes and goes.     HPI: Susan Best is a 38 y.o. female who is here for  Intermittent light headedness when she was smoking, she was going form sitting to standing position. She denies any HA, vision changes, n/w/tingling, slurred speech, CP, palpitations. She then got the chills. She had intermittent dizziness for about 1 hours and she if feeling a bit better. The room is not spinning, she is just light headed. Has been eating and drinking normal. She is a smoker 1/2 ppd for many years. No other sxs.  She has not had any urinary sxs, no prior hx of vertgo, this has never happened before, no ear sxs. No new meds.   Past Medical History  Diagnosis Date  . Abnormal finding on Pap smear, ASCUS 2001    neg hpv  . Thrombocytopenia    Past Surgical History  Procedure Laterality Date  . No past surgeries     History   Social History  . Marital Status: Single    Spouse Name: N/A    Number of Children: N/A  . Years of Education: N/A   Social History Main Topics  . Smoking status: Current Every Day Smoker -- 1.00 packs/day    Types: Cigarettes  . Smokeless tobacco: Never Used  . Alcohol Use: 1.5 oz/week    3 drink(s) per week  . Drug Use: No  . Sexual Activity: None   Other Topics Concern  . None   Social History Narrative   Occupation: 40 per week   7 hours of sleep   HH of 1   No pets   Exercise occasion         Family History  Problem Relation Age of Onset  . Hyperlipidemia    . Hypertension    . GER disease Father    No Known Allergies Prior to Admission medications   Medication Sig Start Date End Date Taking? Authorizing Provider  cyclobenzaprine (FLEXERIL) 10 MG tablet Take 10 mg by mouth 3 (three) times daily as needed for muscle spasms.    Historical Provider, MD    diclofenac (VOLTAREN) 75 MG EC tablet Take 1 tablet (75 mg total) by mouth 2 (two) times daily. 01/27/12   Nelwyn SalisburyStephen A Fry, MD  ketorolac (TORADOL) 10 MG tablet Take 10 mg by mouth every 6 (six) hours as needed for pain.    Historical Provider, MD  ranitidine (ZANTAC) 150 MG tablet Take 150 mg by mouth 2 (two) times daily as needed. For acid reflux    Historical Provider, MD     ROS: The patient denies fevers,  night sweats, unintentional weight loss, chest pain, palpitations, wheezing, dyspnea on exertion, nausea, vomiting, abdominal pain, dysuria, hematuria, melena, numbness, weakness, or tingling.   All other systems have been reviewed and were otherwise negative with the exception of those mentioned in the HPI and as above.    PHYSICAL EXAM: Filed Vitals:   12/12/13 1213  BP: 102/60  Pulse: 66  Temp: 97.8 F (36.6 C)  Resp: 16   There were no vitals filed for this visit. There is no weight on file to calculate BMI.  General: Alert, no acute distress HEENT:  Normocephalic, atraumatic, oropharynx patent. EOMI, PERRLA. Fundo exam normal. TM  nl.  Cardiovascular:  Regular rate and rhythm, no rubs murmurs or gallops.  No Carotid bruits, radial pulse intact. No pedal edema.  Respiratory: Clear to auscultation bilaterally.  No wheezes, rales, or rhonchi.  No cyanosis, no use of accessory musculature GI: No organomegaly, abdomen is soft and non-tender, positive bowel sounds.  No masses. Skin: No rashes. Neurologic: Facial musculature symmetric. CN 2 -1 2 grossly nl. ROmberg neg, heel to toe neg, finger to nose neg. NEg dix hallpike, neg nystagmus Psychiatric: Patient is appropriate throughout our interaction. No slurred sppech Lymphatic: No cervical lymphadenopathy Musculoskeletal: Gait intact. 5/5 strength, 2/2 DTRs, sensation UE and [place] intact   LABS: Results for orders placed or performed in visit on 12/12/13  POCT UA - Microscopic Only  Result Value Ref Range   WBC, Ur, HPF, POC  0-1    RBC, urine, microscopic 0-2    Bacteria, U Microscopic neg    Mucus, UA neg    Epithelial cells, urine per micros 0-1    Crystals, Ur, HPF, POC neg    Casts, Ur, LPF, POC neg    Yeast, UA neg   POCT urinalysis dipstick  Result Value Ref Range   Color, UA light yellow    Clarity, UA clear    Glucose, UA neg    Bilirubin, UA neg    Ketones, UA neg    Spec Grav, UA <=1.005    Blood, UA neg    pH, UA 6.5    Protein, UA neg    Urobilinogen, UA 0.2    Nitrite, UA neg    Leukocytes, UA Negative   POCT CBC  Result Value Ref Range   WBC 4.4 (A) 4.6 - 10.2 K/uL   Lymph, poc 1.4 0.6 - 3.4   POC LYMPH PERCENT 32.2 10 - 50 %L   MID (cbc) 0.2 0 - 0.9   POC MID % 4.8 0 - 12 %M   POC Granulocyte 2.8 2 - 6.9   Granulocyte percent 63.0 37 - 80 %G   RBC 4.78 4.04 - 5.48 M/uL   Hemoglobin 14.0 12.2 - 16.2 g/dL   HCT, POC 16.142.8 09.637.7 - 47.9 %   MCV 89.5 80 - 97 fL   MCH, POC 29.2 27 - 31.2 pg   MCHC 32.6 31.8 - 35.4 g/dL   RDW, POC 04.512.2 %   Platelet Count, POC 139 (A) 142 - 424 K/uL   MPV 7.9 0 - 99.8 fL     EKG/XRAY:   [facility]   ASSESSMENT/PLAN: Encounter Diagnoses  Name Primary?  . Dizziness and giddiness Yes  . Chills     [age] y.o female with PMH of tobacco use/dependence who is here for intermittent dizziness for 1 hour She was borderline orthostatic in BP and definitive in HR, Based on history it sound very positional.  BP laying 110/74, 54; Sitting 102/60, 66; Standing 116/79, 79 EKG was Sinus Brady at 58 bpm, anterior T waves nonspecific, and she had slightly y multiform P waves in lead II only.  She is a smoker.  CBC and urine was normal CMP pending I went ahead and gave her precautions, push fluids, no caffeine. She will go to ER prn F/u prn   Gross sideeffects, risk and benefits, and alternatives of medications d/w patient. Patient is aware that all medications have potential sideeffects and we are unable to predict every  sideeffect or drug-drug interaction that may occur.  Susan Werntz PHUONG,  DO 12/12/2013 1:46 PM

## 2013-12-12 NOTE — Patient Instructions (Signed)
Orthostatic Hypotension Orthostatic hypotension is a sudden drop in blood pressure. It happens when you quickly stand up from a seated or lying position. You may feel dizzy or light-headed. This can last for just a few seconds or for up to a few minutes. It is usually not a serious problem. However, if this happens frequently or gets worse, it can be a sign of something more serious. CAUSES  Different things can cause orthostatic hypotension, including:   Loss of body fluids (dehydration).  Medicines that lower blood pressure.  Sudden changes in posture, such as standing up quickly after you have been sitting or lying down.  Taking too much of your medicine. SIGNS AND SYMPTOMS   Light-headedness or dizziness.   Fainting or near-fainting.   A fast heart rate.   Weakness.   Feeling tired (fatigue).  DIAGNOSIS  Your health care provider may do several things to help diagnose your condition and identify the cause. These may include:   Taking a medical history and doing a physical exam.  Checking your blood pressure. Your health care provider will check your blood pressure when you are:  Lying down.  Sitting.  Standing.  Using tilt table testing. In this test, you lie down on a table that moves from a lying position to a standing position. You will be strapped onto the table. This test monitors your blood pressure and heart rate when you are in different positions. TREATMENT  Treatment will vary depending on the cause. Possible treatments include:   Changing the dosage of your medicines.  Wearing compression stockings on your lower legs.  Standing up slowly after sitting or lying down.  Eating more salt.  Eating frequent, small meals.  In some cases, getting IV fluids.  Taking medicine to enhance fluid retention. HOME CARE INSTRUCTIONS  Only take over-the-counter or prescription medicines as directed by your health care provider.  Follow your health care  provider's instructions for changing the dosage of your current medicines.  Do not stop or adjust your medicine on your own.  Stand up slowly after sitting or lying down. This allows your body to adjust to the different position.  Wear compression stockings as directed.  Eat extra salt as directed.  Do not add extra salt to your diet unless directed to by your health care provider.  Eat frequent, small meals.  Avoid standing suddenly after eating.  Avoid hot showers or excessive heat as directed by your health care provider.  Keep all follow-up appointments. SEEK MEDICAL CARE IF:  You continue to feel dizzy or light-headed after standing.  You feel groggy or confused.  You feel cold, clammy, or sick to your stomach (nauseous).  You have blurred vision.  You feel short of breath. SEEK IMMEDIATE MEDICAL CARE IF:   You faint after standing.  You have chest pain.  You have difficulty breathing.   You lose feeling or movement in your arms or legs.   You have slurred speech or difficulty talking, or you are unable to talk.  MAKE SURE YOU:   Understand these instructions.  Will watch your condition.  Will get help right away if you are not doing well or get worse. Document Released: 01/17/2002 Document Revised: 02/01/2013 Document Reviewed: 11/19/2012 [facility] Patient Information 2015 [place], [place]. This information is not intended to replace advice given to you by your health care provider. Make sure you discuss any questions you have with your health care provider. Hypotension As your heart beats, it forces blood  through your arteries. This force is your blood pressure. If your blood pressure is too low for you to go about your normal activities or to support the organs of your body, you have hypotension. Hypotension is also referred to as low blood pressure. When your blood pressure becomes too low, you may not get enough blood to your brain. As a result, you may  feel weak, feel lightheaded, or develop a rapid heart rate. In a more severe case, you may faint. CAUSES Various conditions can cause hypotension. These include:  Blood loss.  Dehydration.  Heart or endocrine problems.  Pregnancy.  Severe infection.  Not having a well-balanced diet filled with needed nutrients.  Severe allergic reactions (anaphylaxis). Some medicines, such as blood pressure medicine or water pills (diuretics), may lower your blood pressure below normal. Sometimes taking too much medicine or taking medicine not as directed can cause hypotension. TREATMENT  Hospitalization is sometimes required for hypotension if fluid or blood replacement is needed, if time is needed for medicines to wear off, or if further monitoring is needed. Treatment might include changing your diet, changing your medicines (including medicines aimed at raising your blood pressure), and use of support stockings. HOME CARE INSTRUCTIONS   Drink enough fluids to keep your urine clear or pale yellow.  Take your medicines as directed by your health care provider.  Get up slowly from reclining or sitting positions. This gives your blood pressure a chance to adjust.  Wear support stockings as directed by your health care provider.  Maintain a healthy diet by including nutritious food, such as fruits, vegetables, nuts, whole grains, and lean meats. SEEK MEDICAL CARE IF:  You have vomiting or diarrhea.  You have a fever for more than 2-3 days.  You feel more thirsty than usual.  You feel weak and tired. SEEK IMMEDIATE MEDICAL CARE IF:   You have chest pain or a fast or irregular heartbeat.  You have a loss of feeling in some part of your body, or you lose movement in your arms or legs.  You have trouble speaking.  You become sweaty or feel lightheaded.  You faint. MAKE SURE YOU:   Understand these instructions.  Will watch your condition.  Will get help right away if you are not  doing well or get worse. Document Released: 01/27/2005 Document Revised: 11/17/2012 Document Reviewed: 07/30/2012 [facility] Patient Information 2015 [place], [place]. This information is not intended to replace advice given to you by your health care provider. Make sure you discuss any questions you have with your health care provider.

## 2013-12-13 ENCOUNTER — Telehealth: Payer: Self-pay

## 2013-12-13 LAB — COMPREHENSIVE METABOLIC PANEL WITH GFR
AST: 15 U/L (ref 0–37)
Alkaline Phosphatase: 45 U/L (ref 39–117)
Creat: 0.72 mg/dL (ref 0.50–1.10)
Potassium: 4.5 meq/L (ref 3.5–5.3)

## 2013-12-13 LAB — COMPREHENSIVE METABOLIC PANEL
ALT: 9 U/L (ref 0–35)
Albumin: 4.5 g/dL (ref 3.5–5.2)
BUN: 9 mg/dL (ref 6–23)
CO2: 25 mEq/L (ref 19–32)
Calcium: 9.7 mg/dL (ref 8.4–10.5)
Chloride: 105 mEq/L (ref 96–112)
Glucose, Bld: 83 mg/dL (ref 70–99)
Sodium: 139 mEq/L (ref 135–145)
Total Bilirubin: 0.4 mg/dL (ref 0.2–1.2)
Total Protein: 6.7 g/dL (ref 6.0–8.3)

## 2013-12-13 NOTE — Telephone Encounter (Signed)
Dr. Conley RollsLe, pt returning a call in regards to your MyChart message. Was unable to see a way to reply. Pt feels somewhat better today. Still dizzy when moves suddenly. Drinking lots of fluids. Pt concerned about her abnl EKG. Wants to know if you can comment on that. Thanks  Best #: [phone]

## 2013-12-13 NOTE — Telephone Encounter (Signed)
Spoke with patient , she is getting better gradually. She is asymptomatic for sudden movements. Will cont to monitor, will call me next week to let me know. Declined referral to cardiology since has no insurance, she is in between jobs. We may just get a repeat EKG.

## 2013-12-30 ENCOUNTER — Encounter: Payer: Self-pay | Admitting: Family Medicine

## 2013-12-30 ENCOUNTER — Ambulatory Visit (INDEPENDENT_AMBULATORY_CARE_PROVIDER_SITE_OTHER): Payer: Self-pay | Admitting: Family Medicine

## 2013-12-30 VITALS — BP 109/75 | HR 80 | Temp 98.4°F | Resp 16 | Ht 62.0 in | Wt 162.6 lb

## 2013-12-30 DIAGNOSIS — R9431 Abnormal electrocardiogram [ECG] [EKG]: Secondary | ICD-10-CM

## 2013-12-30 DIAGNOSIS — R42 Dizziness and giddiness: Secondary | ICD-10-CM

## 2013-12-30 NOTE — Progress Notes (Signed)
Chief Complaint:  Chief Complaint  Patient presents with  . Follow-up  . Dizziness    repeat ekg    HPI: Susan Best is a 38 y.o. female who is here for  Follow-up, wants a repeat EKG She saw me about 2 weeks ago and complained of dizziness, she was slightly orthostatic. An Ekg was done  and showed some right atrial changes, possible minimal enlargement She has risk factors for heart disease such as smoking. But deneis hyperlipidemia, DM. She was self pay and basic labs were done which were all normal.  She was a 20 year 1 pppd  But after that she is now down to 1/2 ppd  12/12/13 OV Susan Best is a 38 y.o. female who is here for Intermittent light headedness when she was smoking, she was going form sitting to standing position. She denies any HA, vision changes, n/w/tingling, slurred speech, CP, palpitations. She then got the chills. She had intermittent dizziness for about 1 hours and she if feeling a bit better. The room is not spinning, she is just light headed. Has been eating and drinking normal. She is a smoker 1/2 ppd for many years. No other sxs.  She has not had any urinary sxs, no prior hx of vertgo, this has never happened before, no ear sxs. No new meds.   Past Medical History  Diagnosis Date  . Abnormal finding on Pap smear, ASCUS 2001    neg hpv  . Thrombocytopenia   . Sickle cell trait   . Anemia     with pregnancy only , dropped down to hgb 7.4   Past Surgical History  Procedure Laterality Date  . No past surgeries     History   Social History  . Marital Status: Single    Spouse Name: N/A    Number of Children: N/A  . Years of Education: N/A   Social History Main Topics  . Smoking status: Current Every Day Smoker -- 1.00 packs/day    Types: Cigarettes  . Smokeless tobacco: Never Used  . Alcohol Use: 1.5 oz/week    3 drink(s) per week  . Drug Use: No  . Sexual Activity: None   Other Topics Concern  . None   Social History Narrative   Occupation: 40 per week   7 hours of sleep   HH of 1   No pets   Exercise occasion         Family History  Problem Relation Age of Onset  . Hyperlipidemia    . Hypertension    . GER disease Father    No Known Allergies Prior to Admission medications   Medication Sig Start Date End Date Taking? Authorizing Provider  cyclobenzaprine (FLEXERIL) 10 MG tablet Take 10 mg by mouth 3 (three) times daily as needed for muscle spasms.   Yes Historical Provider, MD  diclofenac (VOLTAREN) 75 MG EC tablet Take 1 tablet (75 mg total) by mouth 2 (two) times daily. 01/27/12  Yes Nelwyn SalisburyStephen A Fry, MD  ketorolac (TORADOL) 10 MG tablet Take 10 mg by mouth every 6 (six) hours as needed for pain.   Yes Historical Provider, MD  ranitidine (ZANTAC) 150 MG tablet Take 150 mg by mouth 2 (two) times daily as needed. For acid reflux   Yes Historical Provider, MD     ROS: The patient denies fevers, chills, night sweats, unintentional weight loss, chest pain, palpitations, wheezing, dyspnea on exertion, nausea, vomiting, abdominal pain, dysuria,  hematuria, melena, numbness, weakness, or tingling.   All other systems have been reviewed and were otherwise negative with the exception of those mentioned in the HPI and as above.    PHYSICAL EXAM: Filed Vitals:   12/30/13 1257  BP: 109/75  Pulse: 80  Temp: 98.4 F (36.9 C)  Resp: 16   Filed Vitals:   12/30/13 1257  Height: 5\' 2"  (1.575 m)  Weight: 162 lb 9.6 oz (73.755 kg)   Body mass index is 29.73 kg/(m^2).  General: Alert, no acute distress HEENT:  Normocephalic, atraumatic, oropharynx patent. EOMI, PERRLA Cardiovascular:  Regular rate and rhythm, no rubs murmurs or gallops.  No Carotid bruits, radial pulse intact. No pedal edema.  Respiratory: Clear to auscultation bilaterally.  No wheezes, rales, or rhonchi.  No cyanosis, no use of accessory musculature GI: No organomegaly, abdomen is soft and non-tender, positive bowel sounds.  No masses. Skin: No  rashes. Neurologic: Facial musculature symmetric. Psychiatric: Patient is appropriate throughout our interaction. Lymphatic: No cervical lymphadenopathy Musculoskeletal: Gait intact.   LABS: Results for orders placed or performed in visit on 12/12/13  Comprehensive metabolic panel  Result Value Ref Range   Sodium 139 135 - 145 mEq/L   Potassium 4.5 3.5 - 5.3 mEq/L   Chloride 105 96 - 112 mEq/L   CO2 25 19 - 32 mEq/L   Glucose, Bld 83 70 - 99 mg/dL   BUN 9 6 - 23 mg/dL   Creat 1.61 0.96 - 0.45 mg/dL   Total Bilirubin 0.4 0.2 - 1.2 mg/dL   Alkaline Phosphatase 45 39 - 117 U/L   AST 15 0 - 37 U/L   ALT 9 0 - 35 U/L   Total Protein 6.7 6.0 - 8.3 g/dL   Albumin 4.5 3.5 - 5.2 g/dL   Calcium 9.7 8.4 - 40.9 mg/dL  POCT UA - Microscopic Only  Result Value Ref Range   WBC, Ur, HPF, POC 0-1    RBC, urine, microscopic 0-2    Bacteria, U Microscopic neg    Mucus, UA neg    Epithelial cells, urine per micros 0-1    Crystals, Ur, HPF, POC neg    Casts, Ur, LPF, POC neg    Yeast, UA neg   POCT urinalysis dipstick  Result Value Ref Range   Color, UA light yellow    Clarity, UA clear    Glucose, UA neg    Bilirubin, UA neg    Ketones, UA neg    Spec Grav, UA <=1.005    Blood, UA neg    pH, UA 6.5    Protein, UA neg    Urobilinogen, UA 0.2    Nitrite, UA neg    Leukocytes, UA Negative   POCT CBC  Result Value Ref Range   WBC 4.4 (A) 4.6 - 10.2 K/uL   Lymph, poc 1.4 0.6 - 3.4   POC LYMPH PERCENT 32.2 10 - 50 %L   MID (cbc) 0.2 0 - 0.9   POC MID % 4.8 0 - 12 %M   POC Granulocyte 2.8 2 - 6.9   Granulocyte percent 63.0 37 - 80 %G   RBC 4.78 4.04 - 5.48 M/uL   Hemoglobin 14.0 12.2 - 16.2 g/dL   HCT, POC 81.1 91.4 - 47.9 %   MCV 89.5 80 - 97 fL   MCH, POC 29.2 27 - 31.2 pg   MCHC 32.6 31.8 - 35.4 g/dL   RDW, POC 78.2 %   Platelet Count, POC 139 (  A) 142 - 424 K/uL   MPV 7.9 0 - 99.8 fL     EKG/XRAY:   [facility] EKG unchanged from  12/11/13 No ST elevation depression, there is some slight atrial changes/enlargment/multiform p waves No overt bundle branch block   ASSESSMENT/PLAN: Encounter Diagnoses  Name Primary?  . Dizziness and giddiness Yes  . Nonspecific abnormal electrocardiogram (ECG) (EKG)    38 y.o female with improved dizziness, pushing fluids at home have helped She is still has an abnormal EKG but it is unchanged She recently got medicaid and will see her PCP at [facility] to get referral to Cardiology No CP, palpitations, SOB, dizziness currently. Fu prn or go to ER prn   Gross sideeffects, risk and benefits, and alternatives of medications d/w patient. Patient is aware that all medications have potential sideeffects and we are unable to predict every sideeffect or drug-drug interaction that may occur.  LE, THAO PHUONG, DO 12/30/2013 1:26 PM

## 2016-02-11 HISTORY — PX: BREAST BIOPSY: SHX20

## 2016-06-24 ENCOUNTER — Other Ambulatory Visit: Payer: Self-pay | Admitting: Obstetrics

## 2016-06-24 DIAGNOSIS — R5381 Other malaise: Secondary | ICD-10-CM

## 2016-06-25 ENCOUNTER — Other Ambulatory Visit: Payer: Self-pay | Admitting: Obstetrics

## 2016-06-25 DIAGNOSIS — R928 Other abnormal and inconclusive findings on diagnostic imaging of breast: Secondary | ICD-10-CM

## 2016-06-26 ENCOUNTER — Other Ambulatory Visit: Payer: Self-pay | Admitting: Obstetrics

## 2016-06-26 ENCOUNTER — Ambulatory Visit
Admission: RE | Admit: 2016-06-26 | Discharge: 2016-06-26 | Disposition: A | Discharge status: Home / Self Care | Payer: Managed Care, Other (non HMO) | Source: Ambulatory Visit | Attending: Obstetrics | Admitting: Obstetrics

## 2016-06-26 DIAGNOSIS — R921 Mammographic calcification found on diagnostic imaging of breast: Secondary | ICD-10-CM

## 2016-06-26 DIAGNOSIS — R928 Other abnormal and inconclusive findings on diagnostic imaging of breast: Secondary | ICD-10-CM

## 2016-06-30 ENCOUNTER — Ambulatory Visit
Admission: RE | Admit: 2016-06-30 | Discharge: 2016-06-30 | Disposition: A | Discharge status: Home / Self Care | Payer: Managed Care, Other (non HMO) | Source: Ambulatory Visit | Attending: Obstetrics | Admitting: Obstetrics

## 2016-06-30 DIAGNOSIS — R921 Mammographic calcification found on diagnostic imaging of breast: Secondary | ICD-10-CM

## 2016-10-31 ENCOUNTER — Encounter: Payer: Self-pay | Admitting: Internal Medicine

## 2017-09-24 ENCOUNTER — Telehealth: Payer: Self-pay | Admitting: Neurology

## 2017-09-24 NOTE — Telephone Encounter (Signed)
Letter was generated

## 2018-08-05 ENCOUNTER — Other Ambulatory Visit: Payer: Self-pay | Admitting: Obstetrics

## 2018-08-05 DIAGNOSIS — N6320 Unspecified lump in the left breast, unspecified quadrant: Secondary | ICD-10-CM

## 2018-08-05 LAB — HM MAMMOGRAPHY

## 2018-08-12 ENCOUNTER — Other Ambulatory Visit: Payer: Self-pay

## 2018-08-12 ENCOUNTER — Ambulatory Visit
Admission: RE | Admit: 2018-08-12 | Discharge: 2018-08-12 | Disposition: A | Discharge status: Home / Self Care | Payer: 59 | Source: Ambulatory Visit | Attending: Obstetrics | Admitting: Obstetrics

## 2018-08-12 ENCOUNTER — Ambulatory Visit
Admission: RE | Admit: 2018-08-12 | Discharge: 2018-08-12 | Disposition: A | Discharge status: Home / Self Care | Payer: Managed Care, Other (non HMO) | Source: Ambulatory Visit | Attending: Obstetrics | Admitting: Obstetrics

## 2018-08-12 DIAGNOSIS — N6320 Unspecified lump in the left breast, unspecified quadrant: Secondary | ICD-10-CM

## 2018-08-30 ENCOUNTER — Encounter: Payer: Self-pay | Admitting: Internal Medicine

## 2019-07-08 ENCOUNTER — Other Ambulatory Visit: Payer: Self-pay

## 2019-07-08 ENCOUNTER — Telehealth (INDEPENDENT_AMBULATORY_CARE_PROVIDER_SITE_OTHER): Payer: 59 | Admitting: Internal Medicine

## 2019-07-08 ENCOUNTER — Encounter: Payer: Self-pay | Admitting: Internal Medicine

## 2019-07-08 VITALS — Temp 97.0°F | Ht 62.0 in | Wt 155.0 lb

## 2019-07-08 DIAGNOSIS — R058 Other specified cough: Secondary | ICD-10-CM

## 2019-07-08 DIAGNOSIS — Z720 Tobacco use: Secondary | ICD-10-CM

## 2019-07-08 DIAGNOSIS — R05 Cough: Secondary | ICD-10-CM

## 2019-07-08 NOTE — Progress Notes (Signed)
Virtual Visit via Video Note  I connected with@ on 07/08/19 at  1:30 PM EDT by a video enabled telemedicine application and verified that I am speaking with the correct person using two identifiers. Location patient: home Location provider:work  office Persons participating in the virtual visit: patient, provider  [place] recommendations  regarding COVID 19 pandemic   video visit is advised over in office visit for this patient.  Patient aware  of the limitations of evaluation and management by telemedicine and  availability of in person appointments. and agreed to proceed.   HPI: Susan Best presents for video visit  Had second  vaccine pfizer on May 12    Felt tried after and soon developed a head cold sx  And then cough fatigue  ? wheezing at night  Some better today  Has been taking mucinex and night time  Some help   No real sob fever chills cp at this time.No hemoptysis some phlegm and better after clears  Continue to smoke use tobacco  Daughter 10 not sick . No vomiting gi sx .  ROS: See pertinent positives and negatives per HPI.  Past Medical History:  Diagnosis Date  . Abnormal finding on Pap smear, ASCUS 2001   neg hpv  . Anemia    with pregnancy only , dropped down to hgb 7.4  . Sickle cell trait ([place])   . Thrombocytopenia ([place])     Past Surgical History:  Procedure Laterality Date  . BREAST BIOPSY Left 2018  . CESAREAN SECTION  2010  . NO PAST SURGERIES      Family History  Problem Relation Age of Onset  . Hyperlipidemia Other   . Hypertension Other   . GER disease Father   . Breast cancer Cousin 86       1st cousin paternal side    Social History   Tobacco Use  . Smoking status: Current Every Day Smoker    Packs/day: 1.00    Types: Cigarettes  . Smokeless tobacco: Never Used  Substance Use Topics  . Alcohol use: Yes    Alcohol/week: 3.0 standard drinks    Types: 3 Standard drinks or equivalent per week  . Drug use: No      Current  Outpatient Medications:  Marland Kitchen  Multiple Vitamin (THERA) TABS, Take by mouth., Disp: , Rfl:  .  ranitidine (ZANTAC) 150 MG tablet, Take 150 mg by mouth 2 (two) times daily as needed. For acid reflux, Disp: , Rfl:   EXAM: BP Readings from Last 3 Encounters:  12/30/13 109/75  12/12/13 102/60  07/13/12 96/62    VITALS per patient if applicable:  GENERAL: alert, oriented, appears well and in no acute distress looks well ocass bronchial cough  HEENT: atraumatic, conjunttiva clear, no obvious abnormalities on inspection of external nose and ears NECK: normal movements of the head and neck LUNGS: on inspection no signs of respiratory distress, breathing rate appears normal, no obvious gross SOB, gasping or wheezing CV: no obvious cyanosis MS: moves all visible extremities without noticeable abnormality PSYCH/NEURO: pleasant and cooperative, no obvious depression or anxiety, speech and thought processing grossly intact   ASSESSMENT AND PLAN:  Discussed the following assessment and plan:    ICD-10-CM   1. Respiratory tract congestion with cough  R05   2. Tobacco use  Z72.0    resp infection cough sounds like  Viral bronchitis  Developed before  fup llimmunity  Second vaccine  Could be community illness  consider check  for covid 19 This is not side effect of the vaccine   Although fatigue could have been . Told her glad she got the vaccine  No alarm sx today    Seek help for alarm sx fever chills sob local pain unrelieved but the lon  hemoptysis Disc poss bronchodilator trial   But for now  mucinex  Fluids and avoid  Tobacco   If no better in another week check with [place] but should improve but cough could last longer  Counseled.   Expectant management and discussion of plan and treatment with opportunity to ask questions and all were answered. The patient agreed with the plan and demonstrated an understanding of the instructions.   Advised to call back or seek an in-person evaluation if  worsening  or having  further concerns . Return if symptoms worsen or fail to improve as expected.    Susan Andreas, MD

## 2020-09-04 ENCOUNTER — Other Ambulatory Visit: Payer: Self-pay | Admitting: Obstetrics

## 2020-09-04 DIAGNOSIS — R928 Other abnormal and inconclusive findings on diagnostic imaging of breast: Secondary | ICD-10-CM

## 2020-09-08 ENCOUNTER — Other Ambulatory Visit: Payer: Self-pay | Admitting: Obstetrics

## 2020-09-08 ENCOUNTER — Ambulatory Visit
Admission: RE | Admit: 2020-09-08 | Discharge: 2020-09-08 | Disposition: A | Discharge status: Home / Self Care | Payer: 59 | Source: Ambulatory Visit | Attending: Obstetrics | Admitting: Obstetrics

## 2020-09-08 DIAGNOSIS — R928 Other abnormal and inconclusive findings on diagnostic imaging of breast: Secondary | ICD-10-CM

## 2020-09-08 DIAGNOSIS — R921 Mammographic calcification found on diagnostic imaging of breast: Secondary | ICD-10-CM

## 2020-09-19 ENCOUNTER — Ambulatory Visit
Admission: RE | Admit: 2020-09-19 | Discharge: 2020-09-19 | Disposition: A | Discharge status: Home / Self Care | Payer: 59 | Source: Ambulatory Visit | Attending: Obstetrics | Admitting: Obstetrics

## 2020-09-19 ENCOUNTER — Other Ambulatory Visit: Payer: Self-pay

## 2020-09-19 DIAGNOSIS — R921 Mammographic calcification found on diagnostic imaging of breast: Secondary | ICD-10-CM

## 2021-06-26 ENCOUNTER — Ambulatory Visit: Payer: 59 | Admitting: Family Medicine

## 2021-10-26 LAB — EXTERNAL GENERIC LAB PROCEDURE: COLOGUARD: NEGATIVE

## 2021-12-25 IMAGING — MG MM BREAST BX W/ LOC DEV 1ST LESION IMAGE BX SPEC STEREO GUIDE*R*
6 of 10 series · 6 of 30 positions shown · non-contrast
Comparison: Previous exams.
COMPARISON: Previous exams.

Addendum:
CLINICAL DATA: Right breast calcifications.

EXAM:
RIGHT BREAST STEREOTACTIC CORE NEEDLE BIOPSY

[R (1 of 4)]
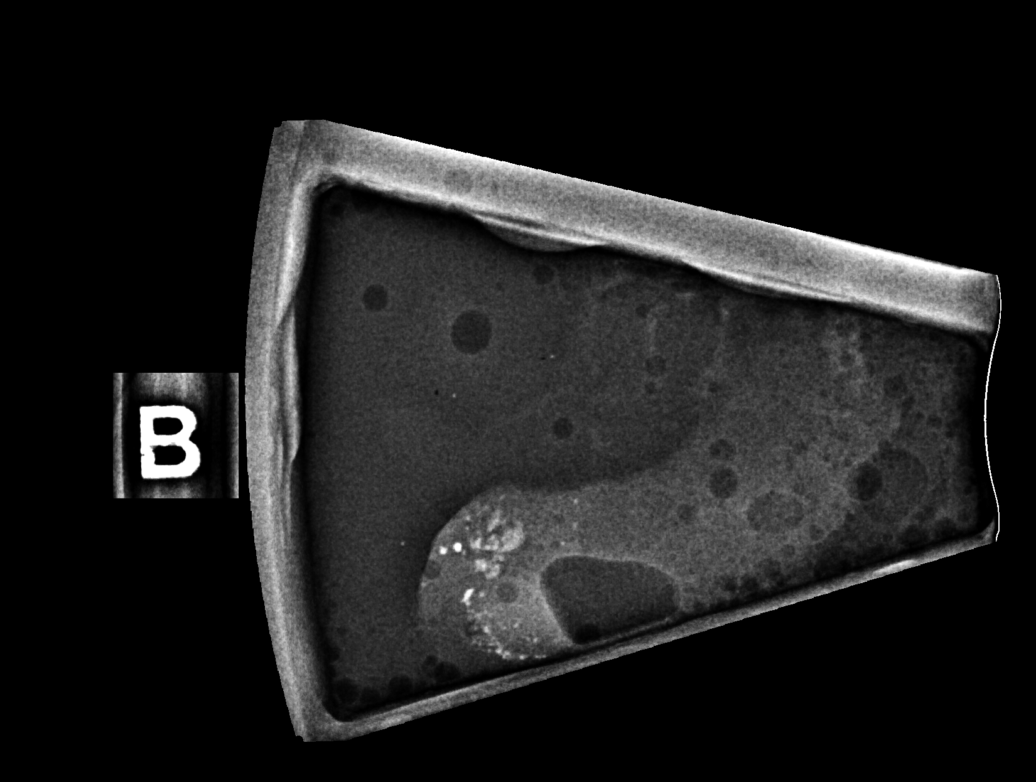

[R (2 of 4)]
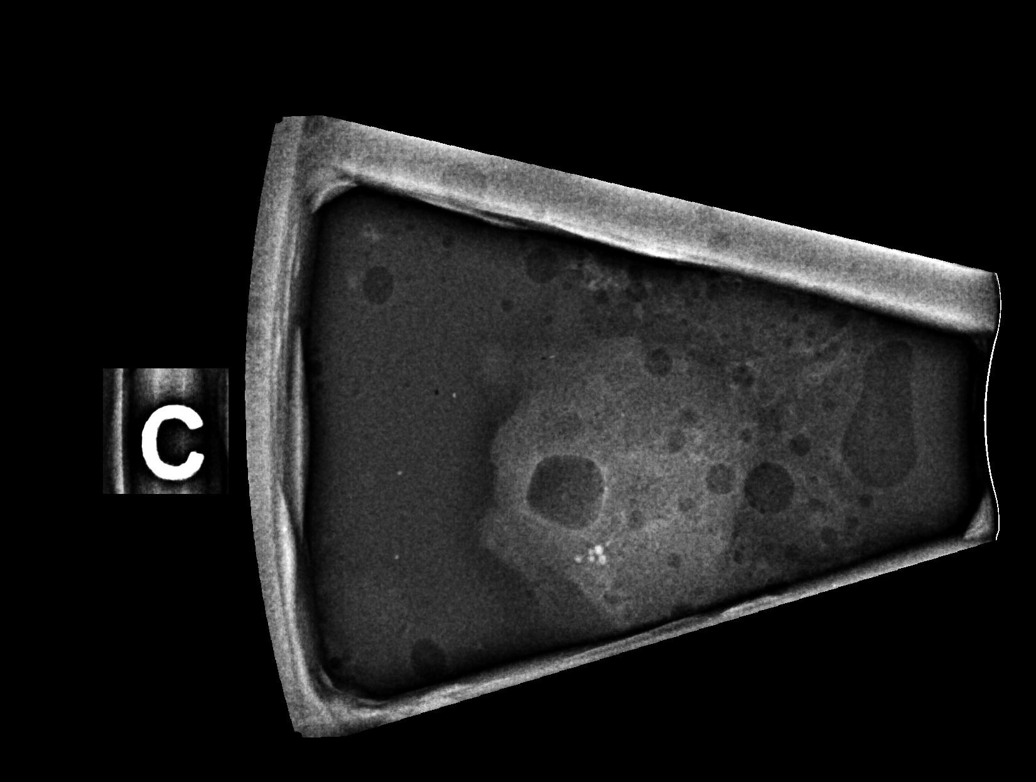

[R (3 of 4)]
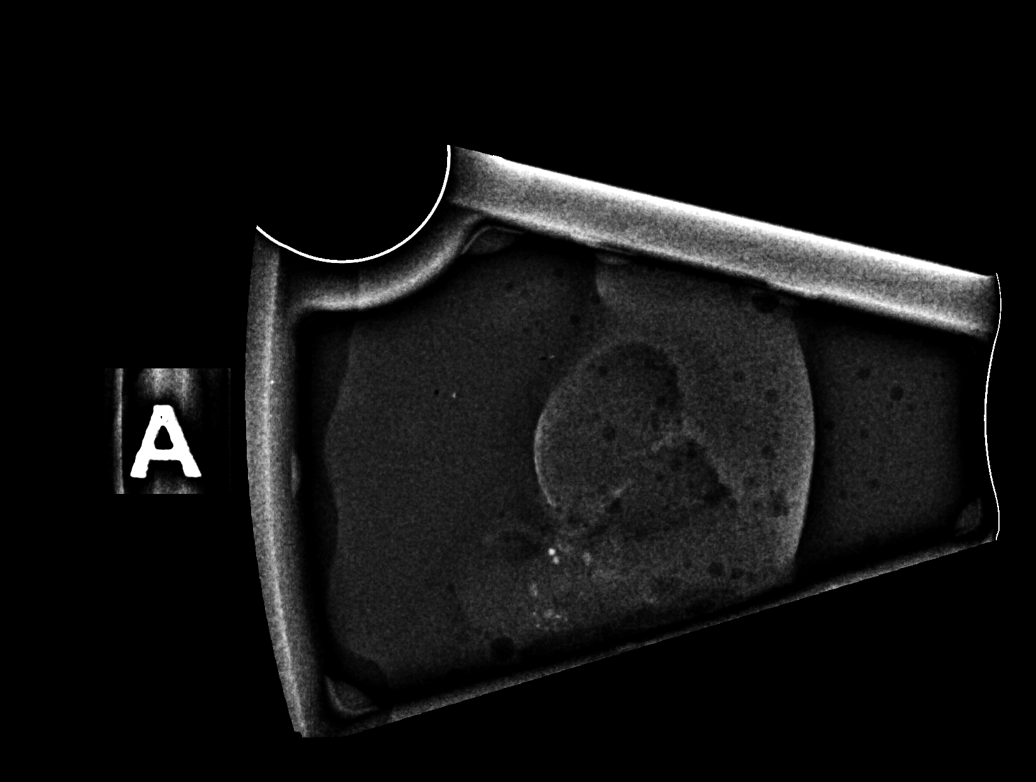

[R (4 of 4)]
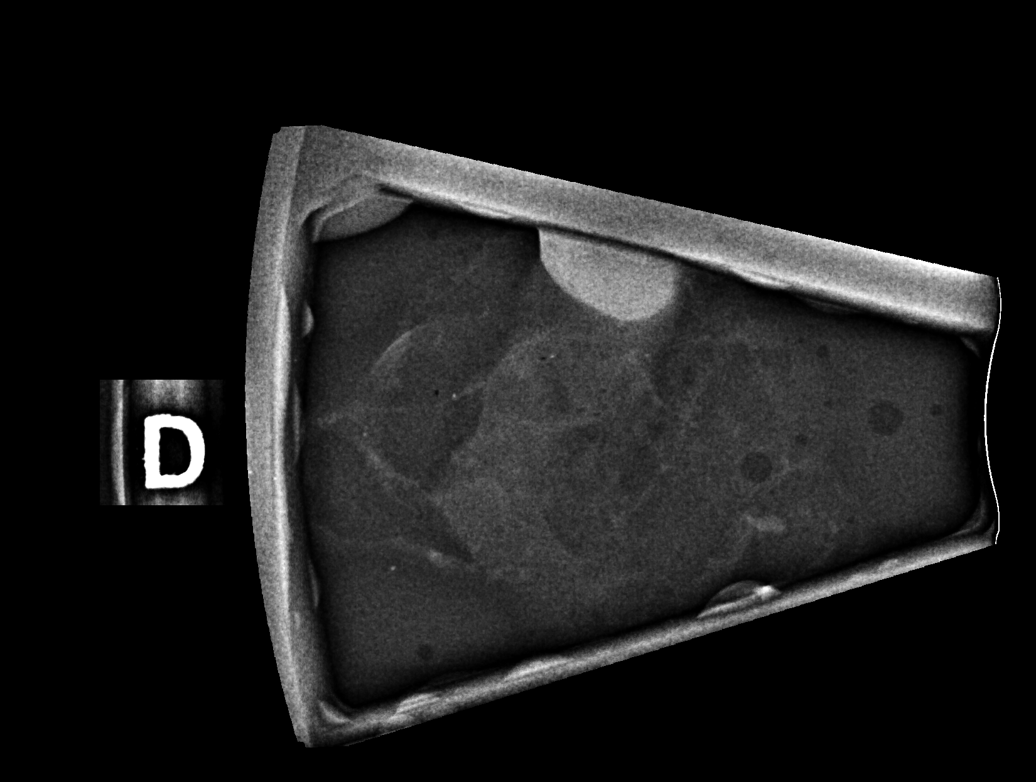

[R CC]
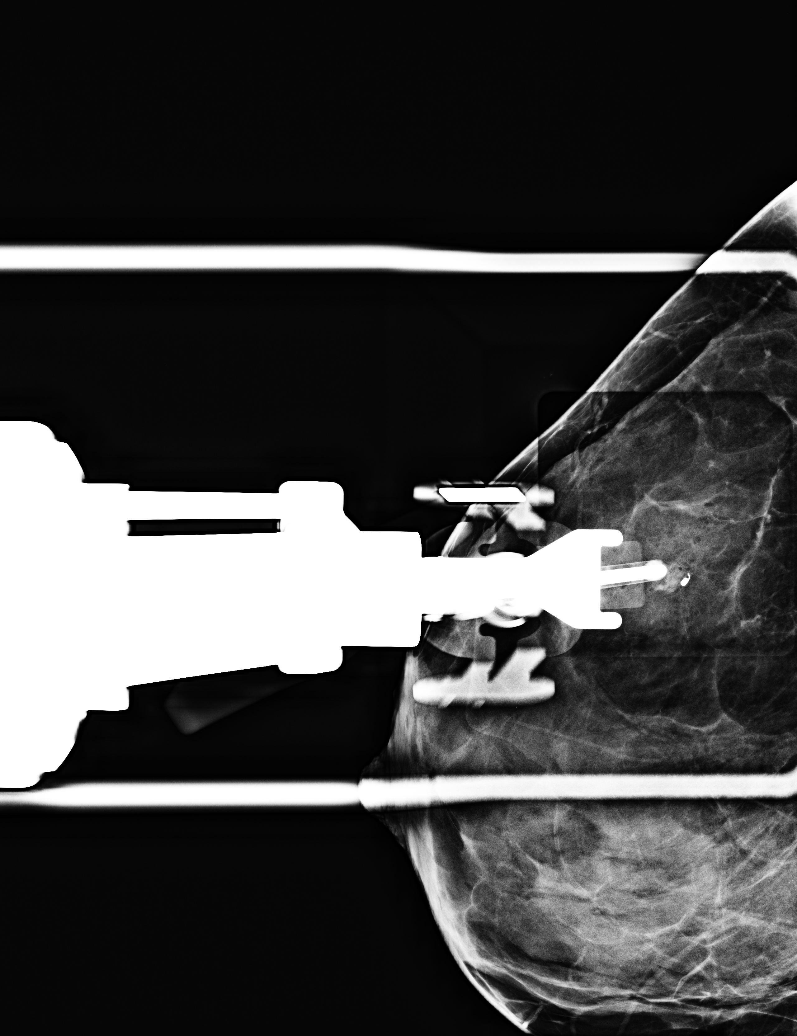

[R CC tomo · tomo slice 23/44.0]
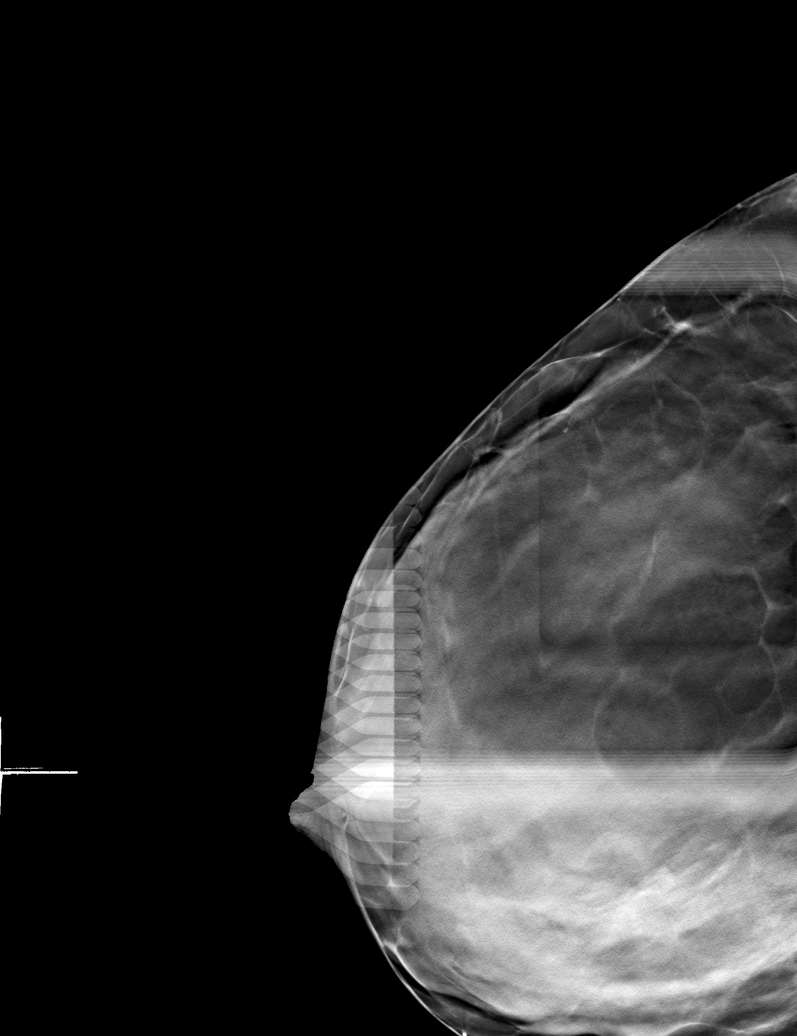

[6 of 30 positions shown; findings below may reference images not displayed]



Using sterile technique and 1% lidocaine and 2% lidocaine with
epinephrine as local anesthetic, under stereotactic guidance, a 9
gauge vacuum assisted device was used to perform core needle biopsy
of calcifications in the upper-outer quadrant of the right breast
using a superior to inferior approach. Specimen radiograph was
performed showing calcifications are present in the tissue samples.
Specimens with calcifications are identified for pathology.

Lesion quadrant: Upper-outer quadrant

At the conclusion of the procedure, coil shaped tissue marker clip
was deployed into the biopsy cavity. Follow-up 2-view mammogram was
performed and dictated separately.
IMPRESSION: Stereotactic-guided biopsy of the right.  No apparent complications.

ADDENDUM:
Pathology revealed AGGREGATED APOCRINE CYSTS WITH CALCIFICATIONS of
the Right breast, upper outer quadrant, (coil clip). This was found
to be concordant by Dr. Gabriel Wah.

Pathology results were discussed with the patient by telephone. The
patient reported doing well after the biopsy with tenderness at the
site. Post biopsy instructions and care were reviewed and questions
were answered. The patient was encouraged to call The [REDACTED] for any additional concerns. My direct phone
number was provided.

The patient was instructed to return for annual screening
mammography at [facility]HOSP[facility] in [HOSPITAL[facility]HOSPITAL].

Pathology results reported by Cirila Eisenman, RN on 09/20/2020.



Using sterile technique and 1% lidocaine and 2% lidocaine with
epinephrine as local anesthetic, under stereotactic guidance, a 9
gauge vacuum assisted device was used to perform core needle biopsy
of calcifications in the upper-outer quadrant of the right breast
using a superior to inferior approach. Specimen radiograph was
performed showing calcifications are present in the tissue samples.
Specimens with calcifications are identified for pathology.

Lesion quadrant: Upper-outer quadrant

At the conclusion of the procedure, coil shaped tissue marker clip
was deployed into the biopsy cavity. Follow-up 2-view mammogram was
performed and dictated separately.
IMPRESSION: Stereotactic-guided biopsy of the right.  No apparent complications.

## 2023-02-01 ENCOUNTER — Emergency Department (HOSPITAL_COMMUNITY): Payer: Managed Care, Other (non HMO)

## 2023-02-01 ENCOUNTER — Other Ambulatory Visit: Payer: Self-pay

## 2023-02-01 ENCOUNTER — Encounter (HOSPITAL_COMMUNITY): Payer: Self-pay | Admitting: Emergency Medicine

## 2023-02-01 ENCOUNTER — Emergency Department (HOSPITAL_COMMUNITY)
Admission: EM | Admit: 2023-02-01 | Discharge: 2023-02-02 | Disposition: A | Discharge status: Home / Self Care | Payer: Managed Care, Other (non HMO) | Attending: Emergency Medicine | Admitting: Emergency Medicine

## 2023-02-01 DIAGNOSIS — M25562 Pain in left knee: Secondary | ICD-10-CM | POA: Diagnosis present

## 2023-02-01 MED ORDER — NAPROXEN 500 MG PO TABS
500.0000 mg | ORAL_TABLET | Freq: Once | ORAL | Status: AC
  Administered 2023-02-01: 500 mg via ORAL
  Filled 2023-02-01: qty 1

## 2023-02-01 MED ORDER — HYDROMORPHONE HCL 1 MG/ML IJ SOLN
1.0000 mg | Freq: Once | INTRAMUSCULAR | Status: AC
  Administered 2023-02-01: 1 mg via INTRAMUSCULAR
  Filled 2023-02-01: qty 1

## 2023-02-01 NOTE — ED Triage Notes (Signed)
Pt reports left knee pain & stiffness.

## 2023-02-01 NOTE — ED Provider Notes (Signed)
[place] EMERGENCY DEPARTMENT AT [facility] Provider Note   CSN: [id number] Arrival date & time: 02/01/23  2108     History  Chief Complaint  Patient presents with   Knee Pain    Susan Best is a 47 y.o. female.  HPI    47 year old female comes in with chief complaint of knee pain.  Patient states that she has previous history of patellar dislocation and also meniscal injury.  Occasionally her knee will lock up.  This morning, the knee pain woke her up.  Pain is located over the left knee.  Patient has not been able to straighten the leg because of pain.  She has tried RICE therapy at home.  She has not followed up with orthopedic surgery in the recent past. Home Medications Prior to Admission medications   Medication Sig Start Date End Date Taking? Authorizing Provider  Multiple Vitamin (THERA) TABS Take by mouth.    [provider]  ranitidine (ZANTAC) 150 MG tablet Take 150 mg by mouth 2 (two) times daily as needed. For acid reflux    [provider]      Allergies    Patient has no known allergies.    Review of Systems   Review of Systems  All other systems reviewed and are negative.   Physical Exam Updated Vital Signs BP (!) 124/96 (BP Location: Left Arm)   Pulse 86   Temp 98.4 F (36.9 C) (Oral)   Resp 17   SpO2 93%  Physical Exam Vitals and nursing note reviewed.  Constitutional:      Appearance: She is well-developed.  HENT:     Head: Atraumatic.  Cardiovascular:     Rate and Rhythm: Normal rate.  Pulmonary:     Effort: Pulmonary effort is normal.  Musculoskeletal:        General: Tenderness present. No swelling.     Cervical back: Normal range of motion and neck supple.  Skin:    General: Skin is warm and dry.  Neurological:     Mental Status: She is alert and oriented to person, place, and time.     ED Results / Procedures / Treatments   Labs (all labs ordered are listed, but only abnormal results are  displayed) Labs Reviewed - No data to display  EKG None  Radiology DG Knee Left Port Result Date: 02/01/2023 CLINICAL DATA:  Pain and stiffness today. Decreased range of motion. EXAM: PORTABLE LEFT KNEE - 1-2 VIEW COMPARISON:  None Available. FINDINGS: No evidence of fracture, dislocation, or joint effusion. No evidence of arthropathy or other focal bone abnormality. Soft tissues are unremarkable. IMPRESSION: Negative. Electronically Signed   By: Burman Nieves M.D.   On: 02/01/2023 22:19    Procedures Procedures    Medications Ordered in ED Medications  HYDROmorphone (DILAUDID) injection 1 mg (1 mg Intramuscular Given 02/01/23 2144)  naproxen (NAPROSYN) tablet 500 mg (500 mg Oral Given 02/01/23 2145)    ED Course/ Medical Decision Making/ A&P                                 Medical Decision Making Amount and/or Complexity of Data Reviewed Radiology: ordered.  Risk Prescription drug management.   47 year old patient comes in chief complaint of knee pain.  She is having difficulty straightening her knee because of significant pain.  Differential diagnosis includes osteoarthritis of the knee, patella femoral syndrome, meniscal injury of  the knee.  X-ray of the knee ordered and independently interpreted.  There is no evidence of any dislocation.  Patient received IM hydromorphone and then we were able to extend her knee.  We applied knee immobilizer -and have provided her with crutches.  We have advised continued RICE treatment and follow-up with orthopedic surgery.  Final Clinical Impression(s) / ED Diagnoses Final diagnoses:  Acute pain of left knee    Rx / DC Orders ED Discharge Orders     None         Derwood Kaplan, MD 02/01/23 2301

## 2023-02-01 NOTE — Discharge Instructions (Signed)
We saw in the ER for knee pain and discomfort.  It is unsure if you have soft tissue injury or condition such as patellofemoral syndrome.  Recommend that you follow-up with orthopedic doctors in 10 days.

## 2023-02-02 ENCOUNTER — Ambulatory Visit: Payer: Self-pay
